# Patient Record
Sex: Female | Born: 1950 | Race: White | Hispanic: No | Marital: Married | Smoking: Never smoker
Health system: Southern US, Community
[De-identification: ages and names within clinical notes are randomized; demographics above are authoritative.]

## PROBLEM LIST (undated history)

## (undated) DIAGNOSIS — K219 Gastro-esophageal reflux disease without esophagitis: Secondary | ICD-10-CM

## (undated) DIAGNOSIS — E7800 Pure hypercholesterolemia, unspecified: Secondary | ICD-10-CM

## (undated) HISTORY — PX: EXCISION OF BREAST BIOPSY: SHX5822

## (undated) HISTORY — PX: BREAST EXCISIONAL BIOPSY: SUR124

---

## 2003-01-07 ENCOUNTER — Ambulatory Visit (HOSPITAL_COMMUNITY): Admission: RE | Admit: 2003-01-07 | Discharge: 2003-01-08 | Payer: Self-pay | Admitting: *Deleted

## 2004-10-18 ENCOUNTER — Ambulatory Visit: Payer: Self-pay | Admitting: Cardiology

## 2005-06-24 ENCOUNTER — Encounter: Admission: RE | Admit: 2005-06-24 | Discharge: 2005-06-24 | Payer: Self-pay | Admitting: Obstetrics and Gynecology

## 2006-08-09 ENCOUNTER — Encounter: Admission: RE | Admit: 2006-08-09 | Discharge: 2006-08-09 | Payer: Self-pay | Admitting: Family Medicine

## 2007-08-30 ENCOUNTER — Encounter: Admission: RE | Admit: 2007-08-30 | Discharge: 2007-08-30 | Payer: Self-pay | Admitting: Family Medicine

## 2008-09-19 ENCOUNTER — Encounter: Admission: RE | Admit: 2008-09-19 | Discharge: 2008-09-19 | Payer: Self-pay | Admitting: Family Medicine

## 2009-05-25 ENCOUNTER — Encounter: Admission: RE | Admit: 2009-05-25 | Discharge: 2009-05-25 | Payer: Self-pay | Admitting: Family Medicine

## 2009-10-02 ENCOUNTER — Encounter: Admission: RE | Admit: 2009-10-02 | Discharge: 2009-10-02 | Payer: Self-pay | Admitting: Family Medicine

## 2010-07-30 NOTE — Discharge Summary (Signed)
NAMEBRODY, Andrea Avery                          ACCOUNT NO.:  [id number]   MEDICAL RECORD NO.:  [id number]                   PATIENT TYPE:  OIB   LOCATION:  2021                                 FACILITY:  [facility]   PHYSICIAN:  Veneda Melter, M.D.                   DATE OF BIRTH:  Oct 24, 1950   DATE OF ADMISSION:  01/07/2003  DATE OF DISCHARGE:  01/08/2003                                 DISCHARGE SUMMARY   PRIMARY DIAGNOSIS:  Atypical chest pain.   SECONDARY DIAGNOSIS:  Hyperlipidemia.   HISTORY OF PRESENT ILLNESS:  This is a 60 year old female with treated  hyperlipidemia, on Lipitor.  She also has a family history of premature  coronary disease.  Her father died in his 60s with acute myocardial  infarction, mild hypertension.  She denies history of diabetes or tobacco  abuse.  She is otherwise a fairly active individual, walks or jogs two or  three times a week.  She works in [employer] at [employer].  The  patient, however, complains of a three-month history of substernal chest  pain, reports tightness.  She describes more burning sensation in lower  sternal, epigastric area.  She reports that the pain has been ongoing and  occurs on a daily basis but appears to worsen with exertion and stress.  She  was admitted on December 17, 2002, to [facility] due to worsening pain  complaints.  A 12-lead EKG was normal.  She ruled out for an MI.  Was seen  by Dr. Diona Browner at that time.  She had undergone a stress echo study which  was essentially normal although the images were only moderate quality.  It  was felt that the patient was at low risk.  She did receive further GI  workup with upper endoscopy.  However, due to concerns the patient could  have underlying cardiac disease, the patient was asked to Dr. Cleotis Nipper and  consider catheterization prior to further gastrointestinal workup.   The patient was admitted and underwent a cardiac catheterization.  Cardiac  catheterization shows an EF of 55% with no MR, left main normal, LAD 10-15%  proximal, left circumflex _________ with luminal disease, right intermedius  luminal disease, RCA dominant, trivial disease.  The patient was to be  treated with medical management.  On the day of discharge the patient  complained of some dizziness with standing.  She was given 1 L of lactated  Ringer's and had decrease in her symptoms.  The patient was ambulating in  the halls without dizziness later that afternoon.  Also of note, the  patient's hemoglobin on admission was 12.9, hematocrit 38.1, with decrease  to 10.9 and 32.9 on discharge.  There was no hematoma in her groin.  She  denied any back or abdominal tenderness and was later discharged to home in  stable condition.  She was discharged on:  1. Lipitor  5 mg nightly.  2. Coated aspirin 81 daily.  3. Claritin 10 as needed.  4. Tylenol 1-2 tablets every four to six hours as needed.   No heavy lifting or strenuous activity for four days, no driving for two.  Low-fat, low-salt, low-cholesterol diet.  She was to call if she developed a  lump or any drainage in her groin, any abdominal or back pain.  The patient  was scheduled to see the physician assistant at the [facility] on  January 22, 2003, at 2:15 p.m. and Dr. Dimas Aguas in 7-10 days.  She was to  call and schedule an appointment.  The patient was also to have a repeat CBC  done in one week.      [name], C.R.N.P. [facility]                 Veneda Melter, M.D.    DS/MEDQ  D:  01/08/2003  T:  01/08/2003  Job:  [id number]   cc:   Selinda Flavin  [address]  [place]  [place] [address]  Fax: [phone]   Kathaleen Maser. Cleotis Nipper, M.D.  [address]  [place]  [place] [address]  Fax: [phone]   [facility]  [place]

## 2010-07-30 NOTE — Cardiovascular Report (Signed)
Andrea Avery, Andrea Avery                          ACCOUNT NO.:  [id number]   MEDICAL RECORD NO.:  [id number]                   PATIENT TYPE:  OIB   LOCATION:  2894                                 FACILITY:  [facility]   PHYSICIAN:  Veneda Melter, M.D.                   DATE OF BIRTH:  [birth year]   DATE OF PROCEDURE:  01/07/2003  DATE OF DISCHARGE:                              CARDIAC CATHETERIZATION   PROCEDURES PERFORMED:  1. Left heart catheterization.  2. Left ventriculogram.  3. Selective coronary angiography.  4. Attempted Perclose right femoral artery.   DIAGNOSES:  1. Trivial coronary artery disease.  2. Normal left ventricular systolic function.   HISTORY:  Andrea Avery is a 59 year old white female with hypertension,  dyslipidemia and a family history of coronary disease who presents for  assessment of substernal chest discomfort.  The patient underwent a stress  echo recently which did not show ischemia, but it was suboptimal study due  to persistence of symptoms.  She is referred for further assessment.   TECHNIQUE:  Informed consent was obtained.  The patient brought to the  catheterization lab.  A 6 French sheath was placed in the right femoral  artery using the modified Seldinger technique.  A 6 [place] JL-4 and JR-4  catheter was then used to engage the left and right coronary arteries and  selective angiography performed in various projections using manual  injection contrast.  After termination of this case, the catheters and  sheaths were removed and a Perclose suture closure device was deployed to  the right femoral artery.  Unfortunately, adequate hemostasis was not  achieved and manual pressure was applied.  Hemostasis was then achieved and  the patient transferred to the floor in stable condition.   FINDINGS:   LEFT HEART CATHETERIZATION:  1. Left main trunk:  Large caliber vessel with luminal irregularities.  2. LAD:  This is a medium-caliber vessel that  provides a trivial diagonal     branch in the mid section.  The LAD has mild irregularities of 10-15%.  3. Left circumflex artery:  This is a large caliber vessel that provides a     large first marginal branch in the proximal segment and a small second     marginal branch distally.  The left circumflex system has luminal     irregularities.  4. Ramus intermedius:  This is a medium caliber vessel that provides     anterolateral wall.  The intermediate artery has luminal disease.  5. Right coronary artery dominant.  This is a medium caliber vessel that     provides small posterior descending artery in its terminal segment.  The     right coronary artery has luminal irregularities and mild tortuosity in     the proximal mid section.   LEFT VENTRICULOGRAPHY:  1. Normal end-systolic and end-diastolic dimensions.  2. Overall left ventricular function is  well preserved.  3. Ejection fraction greater than 55%.  4. No mitral regurgitation.  5. LV pressure is 120/10.  6. Aortic pressure is 120/70.  7. LVEDP equals 15.   ASSESSMENT AND PLAN:  [name] is a 60 year old female with trivial  coronary artery disease and well preserved LV function.  Continued medical  therapy will be pursued and other causes of chest pain be investigated.                                                 Veneda Melter, M.D.    NG/MEDQ  D:  01/07/2003  T:  01/07/2003  Job:  [id number]   cc:   Selinda Flavin  [address]  [place]  [place] [address]  Fax: [phone]   Learta Codding, M.D.  [phone]  [address]  [place]  [place] [address]

## 2010-11-08 ENCOUNTER — Other Ambulatory Visit: Payer: Self-pay | Admitting: Family Medicine

## 2010-11-08 DIAGNOSIS — Z1231 Encounter for screening mammogram for malignant neoplasm of breast: Secondary | ICD-10-CM

## 2010-11-19 ENCOUNTER — Ambulatory Visit
Admission: RE | Admit: 2010-11-19 | Discharge: 2010-11-19 | Disposition: A | Discharge status: Home / Self Care | Payer: BC Managed Care – PPO | Source: Ambulatory Visit | Attending: Family Medicine | Admitting: Family Medicine

## 2010-11-19 DIAGNOSIS — Z1231 Encounter for screening mammogram for malignant neoplasm of breast: Secondary | ICD-10-CM

## 2011-09-29 ENCOUNTER — Other Ambulatory Visit: Payer: Self-pay | Admitting: Family Medicine

## 2011-09-29 DIAGNOSIS — Z1231 Encounter for screening mammogram for malignant neoplasm of breast: Secondary | ICD-10-CM

## 2011-11-25 ENCOUNTER — Ambulatory Visit
Admission: RE | Admit: 2011-11-25 | Discharge: 2011-11-25 | Disposition: A | Discharge status: Home / Self Care | Payer: BC Managed Care – PPO | Source: Ambulatory Visit | Attending: Family Medicine | Admitting: Family Medicine

## 2011-11-25 DIAGNOSIS — Z1231 Encounter for screening mammogram for malignant neoplasm of breast: Secondary | ICD-10-CM

## 2012-10-26 ENCOUNTER — Other Ambulatory Visit: Payer: Self-pay

## 2012-10-26 DIAGNOSIS — Z1231 Encounter for screening mammogram for malignant neoplasm of breast: Secondary | ICD-10-CM

## 2012-11-26 ENCOUNTER — Ambulatory Visit
Admission: RE | Admit: 2012-11-26 | Discharge: 2012-11-26 | Disposition: A | Discharge status: Home / Self Care | Payer: BC Managed Care – PPO | Source: Ambulatory Visit

## 2012-11-26 DIAGNOSIS — Z1231 Encounter for screening mammogram for malignant neoplasm of breast: Secondary | ICD-10-CM

## 2013-11-12 ENCOUNTER — Other Ambulatory Visit: Payer: Self-pay

## 2013-11-12 DIAGNOSIS — Z1231 Encounter for screening mammogram for malignant neoplasm of breast: Secondary | ICD-10-CM

## 2013-11-28 ENCOUNTER — Ambulatory Visit
Admission: RE | Admit: 2013-11-28 | Discharge: 2013-11-28 | Disposition: A | Discharge status: Home / Self Care | Payer: BC Managed Care – PPO | Source: Ambulatory Visit

## 2013-11-28 DIAGNOSIS — Z1231 Encounter for screening mammogram for malignant neoplasm of breast: Secondary | ICD-10-CM

## 2014-11-04 ENCOUNTER — Other Ambulatory Visit: Payer: Self-pay

## 2014-11-04 DIAGNOSIS — Z1231 Encounter for screening mammogram for malignant neoplasm of breast: Secondary | ICD-10-CM

## 2014-12-10 ENCOUNTER — Ambulatory Visit
Admission: RE | Admit: 2014-12-10 | Discharge: 2014-12-10 | Disposition: A | Discharge status: Home / Self Care | Payer: Medicare Other | Source: Ambulatory Visit

## 2014-12-10 DIAGNOSIS — Z1231 Encounter for screening mammogram for malignant neoplasm of breast: Secondary | ICD-10-CM | POA: Diagnosis not present

## 2014-12-31 DIAGNOSIS — E7800 Pure hypercholesterolemia, unspecified: Secondary | ICD-10-CM | POA: Diagnosis not present

## 2015-01-07 DIAGNOSIS — E1165 Type 2 diabetes mellitus with hyperglycemia: Secondary | ICD-10-CM | POA: Diagnosis not present

## 2015-01-07 DIAGNOSIS — I10 Essential (primary) hypertension: Secondary | ICD-10-CM | POA: Diagnosis not present

## 2015-01-07 DIAGNOSIS — E039 Hypothyroidism, unspecified: Secondary | ICD-10-CM | POA: Diagnosis not present

## 2015-01-07 DIAGNOSIS — K219 Gastro-esophageal reflux disease without esophagitis: Secondary | ICD-10-CM | POA: Diagnosis not present

## 2015-01-07 DIAGNOSIS — M84374A Stress fracture, right foot, initial encounter for fracture: Secondary | ICD-10-CM | POA: Diagnosis not present

## 2015-01-07 DIAGNOSIS — E782 Mixed hyperlipidemia: Secondary | ICD-10-CM | POA: Diagnosis not present

## 2015-01-07 DIAGNOSIS — Z1389 Encounter for screening for other disorder: Secondary | ICD-10-CM | POA: Diagnosis not present

## 2015-01-07 DIAGNOSIS — Z23 Encounter for immunization: Secondary | ICD-10-CM | POA: Diagnosis not present

## 2015-01-28 DIAGNOSIS — M19042 Primary osteoarthritis, left hand: Secondary | ICD-10-CM | POA: Diagnosis not present

## 2015-01-28 DIAGNOSIS — S92341G Displaced fracture of fourth metatarsal bone, right foot, subsequent encounter for fracture with delayed healing: Secondary | ICD-10-CM | POA: Diagnosis not present

## 2015-09-17 DIAGNOSIS — E039 Hypothyroidism, unspecified: Secondary | ICD-10-CM | POA: Diagnosis not present

## 2015-09-17 DIAGNOSIS — I10 Essential (primary) hypertension: Secondary | ICD-10-CM | POA: Diagnosis not present

## 2015-09-17 DIAGNOSIS — E782 Mixed hyperlipidemia: Secondary | ICD-10-CM | POA: Diagnosis not present

## 2015-09-17 DIAGNOSIS — E1165 Type 2 diabetes mellitus with hyperglycemia: Secondary | ICD-10-CM | POA: Diagnosis not present

## 2015-09-17 DIAGNOSIS — E7800 Pure hypercholesterolemia, unspecified: Secondary | ICD-10-CM | POA: Diagnosis not present

## 2015-09-25 DIAGNOSIS — K219 Gastro-esophageal reflux disease without esophagitis: Secondary | ICD-10-CM | POA: Diagnosis not present

## 2015-09-25 DIAGNOSIS — E782 Mixed hyperlipidemia: Secondary | ICD-10-CM | POA: Diagnosis not present

## 2015-09-25 DIAGNOSIS — Z0000 Encounter for general adult medical examination without abnormal findings: Secondary | ICD-10-CM | POA: Diagnosis not present

## 2015-09-25 DIAGNOSIS — Z6824 Body mass index (BMI) 24.0-24.9, adult: Secondary | ICD-10-CM | POA: Diagnosis not present

## 2015-11-10 ENCOUNTER — Other Ambulatory Visit: Payer: Self-pay | Admitting: Family Medicine

## 2015-11-10 DIAGNOSIS — Z1231 Encounter for screening mammogram for malignant neoplasm of breast: Secondary | ICD-10-CM

## 2015-11-13 DIAGNOSIS — Z23 Encounter for immunization: Secondary | ICD-10-CM | POA: Diagnosis not present

## 2015-11-24 DIAGNOSIS — H40053 Ocular hypertension, bilateral: Secondary | ICD-10-CM | POA: Diagnosis not present

## 2015-12-11 ENCOUNTER — Ambulatory Visit: Payer: Medicare Other

## 2015-12-15 ENCOUNTER — Ambulatory Visit
Admission: RE | Admit: 2015-12-15 | Discharge: 2015-12-15 | Disposition: A | Discharge status: Home / Self Care | Payer: Medicare Other | Source: Ambulatory Visit | Attending: Family Medicine | Admitting: Family Medicine

## 2015-12-15 DIAGNOSIS — Z1231 Encounter for screening mammogram for malignant neoplasm of breast: Secondary | ICD-10-CM

## 2016-02-09 DIAGNOSIS — I10 Essential (primary) hypertension: Secondary | ICD-10-CM | POA: Diagnosis not present

## 2016-02-09 DIAGNOSIS — J019 Acute sinusitis, unspecified: Secondary | ICD-10-CM | POA: Diagnosis not present

## 2016-02-09 DIAGNOSIS — Z6824 Body mass index (BMI) 24.0-24.9, adult: Secondary | ICD-10-CM | POA: Diagnosis not present

## 2016-08-10 DIAGNOSIS — J019 Acute sinusitis, unspecified: Secondary | ICD-10-CM | POA: Diagnosis not present

## 2016-08-10 DIAGNOSIS — Z6824 Body mass index (BMI) 24.0-24.9, adult: Secondary | ICD-10-CM | POA: Diagnosis not present

## 2016-08-10 DIAGNOSIS — K1379 Other lesions of oral mucosa: Secondary | ICD-10-CM | POA: Diagnosis not present

## 2016-09-26 DIAGNOSIS — E1165 Type 2 diabetes mellitus with hyperglycemia: Secondary | ICD-10-CM | POA: Diagnosis not present

## 2016-09-26 DIAGNOSIS — E782 Mixed hyperlipidemia: Secondary | ICD-10-CM | POA: Diagnosis not present

## 2016-09-26 DIAGNOSIS — I10 Essential (primary) hypertension: Secondary | ICD-10-CM | POA: Diagnosis not present

## 2016-09-26 DIAGNOSIS — E7800 Pure hypercholesterolemia, unspecified: Secondary | ICD-10-CM | POA: Diagnosis not present

## 2016-09-29 DIAGNOSIS — E7800 Pure hypercholesterolemia, unspecified: Secondary | ICD-10-CM | POA: Diagnosis not present

## 2016-09-29 DIAGNOSIS — K219 Gastro-esophageal reflux disease without esophagitis: Secondary | ICD-10-CM | POA: Diagnosis not present

## 2016-09-29 DIAGNOSIS — Z6824 Body mass index (BMI) 24.0-24.9, adult: Secondary | ICD-10-CM | POA: Diagnosis not present

## 2016-09-29 DIAGNOSIS — Z0000 Encounter for general adult medical examination without abnormal findings: Secondary | ICD-10-CM | POA: Diagnosis not present

## 2016-10-03 DIAGNOSIS — M810 Age-related osteoporosis without current pathological fracture: Secondary | ICD-10-CM | POA: Diagnosis not present

## 2016-10-03 DIAGNOSIS — M85851 Other specified disorders of bone density and structure, right thigh: Secondary | ICD-10-CM | POA: Diagnosis not present

## 2016-11-15 ENCOUNTER — Other Ambulatory Visit: Payer: Self-pay | Admitting: Family Medicine

## 2016-11-15 DIAGNOSIS — Z1231 Encounter for screening mammogram for malignant neoplasm of breast: Secondary | ICD-10-CM

## 2016-12-13 DIAGNOSIS — Z23 Encounter for immunization: Secondary | ICD-10-CM | POA: Diagnosis not present

## 2016-12-16 ENCOUNTER — Ambulatory Visit
Admission: RE | Admit: 2016-12-16 | Discharge: 2016-12-16 | Disposition: A | Discharge status: Home / Self Care | Payer: Medicare Other | Source: Ambulatory Visit | Attending: Family Medicine | Admitting: Family Medicine

## 2016-12-16 DIAGNOSIS — Z1231 Encounter for screening mammogram for malignant neoplasm of breast: Secondary | ICD-10-CM | POA: Diagnosis not present

## 2017-01-12 DIAGNOSIS — H43393 Other vitreous opacities, bilateral: Secondary | ICD-10-CM | POA: Diagnosis not present

## 2017-05-12 DIAGNOSIS — M79641 Pain in right hand: Secondary | ICD-10-CM | POA: Diagnosis not present

## 2017-05-12 DIAGNOSIS — Z6823 Body mass index (BMI) 23.0-23.9, adult: Secondary | ICD-10-CM | POA: Diagnosis not present

## 2017-05-12 DIAGNOSIS — M1811 Unilateral primary osteoarthritis of first carpometacarpal joint, right hand: Secondary | ICD-10-CM | POA: Diagnosis not present

## 2017-05-25 DIAGNOSIS — M79641 Pain in right hand: Secondary | ICD-10-CM | POA: Diagnosis not present

## 2017-05-25 DIAGNOSIS — Z6823 Body mass index (BMI) 23.0-23.9, adult: Secondary | ICD-10-CM | POA: Diagnosis not present

## 2017-09-29 DIAGNOSIS — I10 Essential (primary) hypertension: Secondary | ICD-10-CM | POA: Diagnosis not present

## 2017-09-29 DIAGNOSIS — E782 Mixed hyperlipidemia: Secondary | ICD-10-CM | POA: Diagnosis not present

## 2017-09-29 DIAGNOSIS — E039 Hypothyroidism, unspecified: Secondary | ICD-10-CM | POA: Diagnosis not present

## 2017-09-29 DIAGNOSIS — E7801 Familial hypercholesterolemia: Secondary | ICD-10-CM | POA: Diagnosis not present

## 2017-09-29 DIAGNOSIS — E1165 Type 2 diabetes mellitus with hyperglycemia: Secondary | ICD-10-CM | POA: Diagnosis not present

## 2017-09-29 DIAGNOSIS — K219 Gastro-esophageal reflux disease without esophagitis: Secondary | ICD-10-CM | POA: Diagnosis not present

## 2017-10-04 DIAGNOSIS — Z23 Encounter for immunization: Secondary | ICD-10-CM | POA: Diagnosis not present

## 2017-10-04 DIAGNOSIS — Z0000 Encounter for general adult medical examination without abnormal findings: Secondary | ICD-10-CM | POA: Diagnosis not present

## 2017-10-04 DIAGNOSIS — I10 Essential (primary) hypertension: Secondary | ICD-10-CM | POA: Diagnosis not present

## 2017-10-04 DIAGNOSIS — E7801 Familial hypercholesterolemia: Secondary | ICD-10-CM | POA: Diagnosis not present

## 2017-10-04 DIAGNOSIS — Z6823 Body mass index (BMI) 23.0-23.9, adult: Secondary | ICD-10-CM | POA: Diagnosis not present

## 2017-10-10 DIAGNOSIS — N6002 Solitary cyst of left breast: Secondary | ICD-10-CM | POA: Diagnosis not present

## 2017-10-10 DIAGNOSIS — N281 Cyst of kidney, acquired: Secondary | ICD-10-CM | POA: Diagnosis not present

## 2017-10-10 DIAGNOSIS — K219 Gastro-esophageal reflux disease without esophagitis: Secondary | ICD-10-CM | POA: Diagnosis not present

## 2017-10-11 DIAGNOSIS — N6081 Other benign mammary dysplasias of right breast: Secondary | ICD-10-CM | POA: Diagnosis not present

## 2017-10-11 DIAGNOSIS — L720 Epidermal cyst: Secondary | ICD-10-CM | POA: Diagnosis not present

## 2017-10-11 DIAGNOSIS — L728 Other follicular cysts of the skin and subcutaneous tissue: Secondary | ICD-10-CM | POA: Diagnosis not present

## 2017-10-17 DIAGNOSIS — E1165 Type 2 diabetes mellitus with hyperglycemia: Secondary | ICD-10-CM | POA: Diagnosis not present

## 2017-10-27 DIAGNOSIS — R079 Chest pain, unspecified: Secondary | ICD-10-CM | POA: Diagnosis not present

## 2017-11-16 ENCOUNTER — Other Ambulatory Visit: Payer: Self-pay | Admitting: Family Medicine

## 2017-11-16 DIAGNOSIS — Z1231 Encounter for screening mammogram for malignant neoplasm of breast: Secondary | ICD-10-CM

## 2017-12-12 DIAGNOSIS — M5432 Sciatica, left side: Secondary | ICD-10-CM | POA: Diagnosis not present

## 2017-12-12 DIAGNOSIS — M5431 Sciatica, right side: Secondary | ICD-10-CM | POA: Diagnosis not present

## 2017-12-12 DIAGNOSIS — M545 Low back pain: Secondary | ICD-10-CM | POA: Diagnosis not present

## 2017-12-12 DIAGNOSIS — Z6824 Body mass index (BMI) 24.0-24.9, adult: Secondary | ICD-10-CM | POA: Diagnosis not present

## 2017-12-19 ENCOUNTER — Ambulatory Visit
Admission: RE | Admit: 2017-12-19 | Discharge: 2017-12-19 | Disposition: A | Discharge status: Home / Self Care | Payer: Medicare Other | Source: Ambulatory Visit | Attending: Family Medicine | Admitting: Family Medicine

## 2017-12-19 DIAGNOSIS — Z1231 Encounter for screening mammogram for malignant neoplasm of breast: Secondary | ICD-10-CM

## 2017-12-28 DIAGNOSIS — Z23 Encounter for immunization: Secondary | ICD-10-CM | POA: Diagnosis not present

## 2018-01-11 DIAGNOSIS — Z6823 Body mass index (BMI) 23.0-23.9, adult: Secondary | ICD-10-CM | POA: Diagnosis not present

## 2018-01-11 DIAGNOSIS — N3000 Acute cystitis without hematuria: Secondary | ICD-10-CM | POA: Diagnosis not present

## 2018-02-17 DIAGNOSIS — M940 Chondrocostal junction syndrome [Tietze]: Secondary | ICD-10-CM | POA: Diagnosis not present

## 2018-02-17 DIAGNOSIS — Z6823 Body mass index (BMI) 23.0-23.9, adult: Secondary | ICD-10-CM | POA: Diagnosis not present

## 2018-02-26 DIAGNOSIS — M940 Chondrocostal junction syndrome [Tietze]: Secondary | ICD-10-CM | POA: Diagnosis not present

## 2018-02-26 DIAGNOSIS — R079 Chest pain, unspecified: Secondary | ICD-10-CM | POA: Diagnosis not present

## 2018-03-21 DIAGNOSIS — M5430 Sciatica, unspecified side: Secondary | ICD-10-CM | POA: Diagnosis not present

## 2018-03-21 DIAGNOSIS — Z6823 Body mass index (BMI) 23.0-23.9, adult: Secondary | ICD-10-CM | POA: Diagnosis not present

## 2018-03-30 DIAGNOSIS — M5126 Other intervertebral disc displacement, lumbar region: Secondary | ICD-10-CM | POA: Diagnosis not present

## 2018-03-30 DIAGNOSIS — M545 Low back pain: Secondary | ICD-10-CM | POA: Diagnosis not present

## 2018-04-05 DIAGNOSIS — M5416 Radiculopathy, lumbar region: Secondary | ICD-10-CM | POA: Diagnosis not present

## 2018-04-06 ENCOUNTER — Other Ambulatory Visit: Payer: Self-pay | Admitting: Neurological Surgery

## 2018-04-06 DIAGNOSIS — M5416 Radiculopathy, lumbar region: Secondary | ICD-10-CM

## 2018-04-09 ENCOUNTER — Other Ambulatory Visit: Payer: Self-pay | Admitting: Neurological Surgery

## 2018-04-09 ENCOUNTER — Ambulatory Visit
Admission: RE | Admit: 2018-04-09 | Discharge: 2018-04-09 | Disposition: A | Discharge status: Home / Self Care | Payer: Medicare Other | Source: Ambulatory Visit | Attending: Neurological Surgery | Admitting: Neurological Surgery

## 2018-04-09 DIAGNOSIS — M5416 Radiculopathy, lumbar region: Secondary | ICD-10-CM

## 2018-04-09 DIAGNOSIS — M5126 Other intervertebral disc displacement, lumbar region: Secondary | ICD-10-CM | POA: Diagnosis not present

## 2018-04-09 MED ORDER — IOPAMIDOL (ISOVUE-M 200) INJECTION 41%
1.0000 mL | Freq: Once | INTRAMUSCULAR | Status: AC
  Administered 2018-04-09: 1 mL via EPIDURAL

## 2018-04-09 MED ORDER — METHYLPREDNISOLONE ACETATE 40 MG/ML INJ SUSP (RADIOLOG
120.0000 mg | Freq: Once | INTRAMUSCULAR | Status: AC
  Administered 2018-04-09: 120 mg via EPIDURAL

## 2018-04-09 NOTE — Discharge Instructions (Signed)

## 2018-09-20 DIAGNOSIS — H43813 Vitreous degeneration, bilateral: Secondary | ICD-10-CM | POA: Diagnosis not present

## 2018-09-20 DIAGNOSIS — H43812 Vitreous degeneration, left eye: Secondary | ICD-10-CM | POA: Diagnosis not present

## 2018-09-20 DIAGNOSIS — H40013 Open angle with borderline findings, low risk, bilateral: Secondary | ICD-10-CM | POA: Diagnosis not present

## 2018-09-20 DIAGNOSIS — H524 Presbyopia: Secondary | ICD-10-CM | POA: Diagnosis not present

## 2018-09-20 DIAGNOSIS — H43811 Vitreous degeneration, right eye: Secondary | ICD-10-CM | POA: Diagnosis not present

## 2018-09-20 DIAGNOSIS — H40053 Ocular hypertension, bilateral: Secondary | ICD-10-CM | POA: Diagnosis not present

## 2018-09-20 DIAGNOSIS — H2513 Age-related nuclear cataract, bilateral: Secondary | ICD-10-CM | POA: Diagnosis not present

## 2018-10-05 DIAGNOSIS — E7801 Familial hypercholesterolemia: Secondary | ICD-10-CM | POA: Diagnosis not present

## 2018-10-05 DIAGNOSIS — E1165 Type 2 diabetes mellitus with hyperglycemia: Secondary | ICD-10-CM | POA: Diagnosis not present

## 2018-10-05 DIAGNOSIS — E039 Hypothyroidism, unspecified: Secondary | ICD-10-CM | POA: Diagnosis not present

## 2018-10-05 DIAGNOSIS — E782 Mixed hyperlipidemia: Secondary | ICD-10-CM | POA: Diagnosis not present

## 2018-10-05 DIAGNOSIS — E7800 Pure hypercholesterolemia, unspecified: Secondary | ICD-10-CM | POA: Diagnosis not present

## 2018-10-05 DIAGNOSIS — I10 Essential (primary) hypertension: Secondary | ICD-10-CM | POA: Diagnosis not present

## 2018-10-05 DIAGNOSIS — K219 Gastro-esophageal reflux disease without esophagitis: Secondary | ICD-10-CM | POA: Diagnosis not present

## 2018-10-09 DIAGNOSIS — I10 Essential (primary) hypertension: Secondary | ICD-10-CM | POA: Diagnosis not present

## 2018-10-09 DIAGNOSIS — Z6823 Body mass index (BMI) 23.0-23.9, adult: Secondary | ICD-10-CM | POA: Diagnosis not present

## 2018-10-09 DIAGNOSIS — K219 Gastro-esophageal reflux disease without esophagitis: Secondary | ICD-10-CM | POA: Diagnosis not present

## 2018-10-09 DIAGNOSIS — E7801 Familial hypercholesterolemia: Secondary | ICD-10-CM | POA: Diagnosis not present

## 2018-10-24 DIAGNOSIS — Z6823 Body mass index (BMI) 23.0-23.9, adult: Secondary | ICD-10-CM | POA: Diagnosis not present

## 2018-10-24 DIAGNOSIS — R238 Other skin changes: Secondary | ICD-10-CM | POA: Diagnosis not present

## 2018-11-16 ENCOUNTER — Other Ambulatory Visit: Payer: Self-pay | Admitting: Family Medicine

## 2018-11-16 DIAGNOSIS — N644 Mastodynia: Secondary | ICD-10-CM

## 2018-11-20 DIAGNOSIS — Z23 Encounter for immunization: Secondary | ICD-10-CM | POA: Diagnosis not present

## 2018-11-27 ENCOUNTER — Other Ambulatory Visit: Payer: Self-pay

## 2018-11-27 ENCOUNTER — Ambulatory Visit
Admission: RE | Admit: 2018-11-27 | Discharge: 2018-11-27 | Disposition: A | Discharge status: Home / Self Care | Payer: Medicare Other | Source: Ambulatory Visit | Attending: Family Medicine | Admitting: Family Medicine

## 2018-11-27 ENCOUNTER — Ambulatory Visit: Payer: Medicare Other

## 2018-11-27 DIAGNOSIS — R928 Other abnormal and inconclusive findings on diagnostic imaging of breast: Secondary | ICD-10-CM | POA: Diagnosis not present

## 2018-11-27 DIAGNOSIS — N644 Mastodynia: Secondary | ICD-10-CM

## 2018-11-28 DIAGNOSIS — K219 Gastro-esophageal reflux disease without esophagitis: Secondary | ICD-10-CM | POA: Diagnosis not present

## 2018-12-04 DIAGNOSIS — Z01812 Encounter for preprocedural laboratory examination: Secondary | ICD-10-CM | POA: Diagnosis not present

## 2018-12-04 DIAGNOSIS — Z20828 Contact with and (suspected) exposure to other viral communicable diseases: Secondary | ICD-10-CM | POA: Diagnosis not present

## 2018-12-06 DIAGNOSIS — Z885 Allergy status to narcotic agent status: Secondary | ICD-10-CM | POA: Diagnosis not present

## 2018-12-06 DIAGNOSIS — K30 Functional dyspepsia: Secondary | ICD-10-CM | POA: Diagnosis not present

## 2018-12-06 DIAGNOSIS — K219 Gastro-esophageal reflux disease without esophagitis: Secondary | ICD-10-CM | POA: Diagnosis not present

## 2018-12-06 DIAGNOSIS — R1013 Epigastric pain: Secondary | ICD-10-CM | POA: Diagnosis not present

## 2018-12-06 DIAGNOSIS — K2950 Unspecified chronic gastritis without bleeding: Secondary | ICD-10-CM | POA: Diagnosis not present

## 2018-12-06 DIAGNOSIS — K2980 Duodenitis without bleeding: Secondary | ICD-10-CM | POA: Diagnosis not present

## 2018-12-07 DIAGNOSIS — L0501 Pilonidal cyst with abscess: Secondary | ICD-10-CM | POA: Diagnosis not present

## 2018-12-10 DIAGNOSIS — M85852 Other specified disorders of bone density and structure, left thigh: Secondary | ICD-10-CM | POA: Diagnosis not present

## 2018-12-10 DIAGNOSIS — M810 Age-related osteoporosis without current pathological fracture: Secondary | ICD-10-CM | POA: Diagnosis not present

## 2018-12-10 DIAGNOSIS — M8589 Other specified disorders of bone density and structure, multiple sites: Secondary | ICD-10-CM | POA: Diagnosis not present

## 2019-01-11 DIAGNOSIS — I10 Essential (primary) hypertension: Secondary | ICD-10-CM | POA: Diagnosis not present

## 2019-01-11 DIAGNOSIS — E782 Mixed hyperlipidemia: Secondary | ICD-10-CM | POA: Diagnosis not present

## 2019-02-01 ENCOUNTER — Other Ambulatory Visit: Payer: Self-pay

## 2019-02-11 DIAGNOSIS — E782 Mixed hyperlipidemia: Secondary | ICD-10-CM | POA: Diagnosis not present

## 2019-02-11 DIAGNOSIS — I10 Essential (primary) hypertension: Secondary | ICD-10-CM | POA: Diagnosis not present

## 2019-02-11 DIAGNOSIS — E039 Hypothyroidism, unspecified: Secondary | ICD-10-CM | POA: Diagnosis not present

## 2019-03-14 DIAGNOSIS — I10 Essential (primary) hypertension: Secondary | ICD-10-CM | POA: Diagnosis not present

## 2019-03-14 DIAGNOSIS — E782 Mixed hyperlipidemia: Secondary | ICD-10-CM | POA: Diagnosis not present

## 2019-04-05 DIAGNOSIS — K644 Residual hemorrhoidal skin tags: Secondary | ICD-10-CM | POA: Diagnosis not present

## 2019-04-12 DIAGNOSIS — E1165 Type 2 diabetes mellitus with hyperglycemia: Secondary | ICD-10-CM | POA: Diagnosis not present

## 2019-04-12 DIAGNOSIS — E039 Hypothyroidism, unspecified: Secondary | ICD-10-CM | POA: Diagnosis not present

## 2019-04-24 DIAGNOSIS — X32XXXS Exposure to sunlight, sequela: Secondary | ICD-10-CM | POA: Diagnosis not present

## 2019-04-24 DIAGNOSIS — L218 Other seborrheic dermatitis: Secondary | ICD-10-CM | POA: Diagnosis not present

## 2019-04-24 DIAGNOSIS — L918 Other hypertrophic disorders of the skin: Secondary | ICD-10-CM | POA: Diagnosis not present

## 2019-05-10 DIAGNOSIS — I10 Essential (primary) hypertension: Secondary | ICD-10-CM | POA: Diagnosis not present

## 2019-05-10 DIAGNOSIS — E7849 Other hyperlipidemia: Secondary | ICD-10-CM | POA: Diagnosis not present

## 2019-06-04 ENCOUNTER — Other Ambulatory Visit: Payer: Self-pay | Admitting: Neurological Surgery

## 2019-06-04 DIAGNOSIS — M5416 Radiculopathy, lumbar region: Secondary | ICD-10-CM

## 2019-06-06 ENCOUNTER — Other Ambulatory Visit: Payer: Self-pay | Admitting: Neurological Surgery

## 2019-06-06 ENCOUNTER — Ambulatory Visit
Admission: RE | Admit: 2019-06-06 | Discharge: 2019-06-06 | Disposition: A | Discharge status: Home / Self Care | Payer: Medicare Other | Source: Ambulatory Visit | Attending: Neurological Surgery | Admitting: Neurological Surgery

## 2019-06-06 ENCOUNTER — Other Ambulatory Visit: Payer: Self-pay

## 2019-06-06 DIAGNOSIS — K219 Gastro-esophageal reflux disease without esophagitis: Secondary | ICD-10-CM | POA: Insufficient documentation

## 2019-06-06 DIAGNOSIS — M5416 Radiculopathy, lumbar region: Secondary | ICD-10-CM | POA: Diagnosis not present

## 2019-06-06 MED ORDER — METHYLPREDNISOLONE ACETATE 40 MG/ML INJ SUSP (RADIOLOG
120.0000 mg | Freq: Once | INTRAMUSCULAR | Status: AC
  Administered 2019-06-06: 120 mg via EPIDURAL

## 2019-06-06 MED ORDER — IOPAMIDOL (ISOVUE-M 200) INJECTION 41%
1.0000 mL | Freq: Once | INTRAMUSCULAR | Status: AC
  Administered 2019-06-06: 1 mL via EPIDURAL

## 2019-06-06 NOTE — Discharge Instructions (Signed)

## 2019-07-18 DIAGNOSIS — R079 Chest pain, unspecified: Secondary | ICD-10-CM | POA: Diagnosis not present

## 2019-07-18 DIAGNOSIS — M940 Chondrocostal junction syndrome [Tietze]: Secondary | ICD-10-CM | POA: Diagnosis not present

## 2019-07-23 DIAGNOSIS — D7389 Other diseases of spleen: Secondary | ICD-10-CM | POA: Diagnosis not present

## 2019-07-23 DIAGNOSIS — J984 Other disorders of lung: Secondary | ICD-10-CM | POA: Diagnosis not present

## 2019-07-23 DIAGNOSIS — I700 Atherosclerosis of aorta: Secondary | ICD-10-CM | POA: Diagnosis not present

## 2019-07-23 DIAGNOSIS — R079 Chest pain, unspecified: Secondary | ICD-10-CM | POA: Diagnosis not present

## 2019-07-26 DIAGNOSIS — E1165 Type 2 diabetes mellitus with hyperglycemia: Secondary | ICD-10-CM | POA: Diagnosis not present

## 2019-07-26 DIAGNOSIS — I10 Essential (primary) hypertension: Secondary | ICD-10-CM | POA: Diagnosis not present

## 2019-08-01 DIAGNOSIS — R079 Chest pain, unspecified: Secondary | ICD-10-CM | POA: Diagnosis not present

## 2019-08-01 DIAGNOSIS — I340 Nonrheumatic mitral (valve) insufficiency: Secondary | ICD-10-CM | POA: Diagnosis not present

## 2019-08-01 DIAGNOSIS — E785 Hyperlipidemia, unspecified: Secondary | ICD-10-CM | POA: Diagnosis not present

## 2019-08-12 DIAGNOSIS — I10 Essential (primary) hypertension: Secondary | ICD-10-CM | POA: Diagnosis not present

## 2019-08-12 DIAGNOSIS — E7849 Other hyperlipidemia: Secondary | ICD-10-CM | POA: Diagnosis not present

## 2019-08-12 DIAGNOSIS — E039 Hypothyroidism, unspecified: Secondary | ICD-10-CM | POA: Diagnosis not present

## 2019-08-12 DIAGNOSIS — E1165 Type 2 diabetes mellitus with hyperglycemia: Secondary | ICD-10-CM | POA: Diagnosis not present

## 2019-08-14 DIAGNOSIS — N644 Mastodynia: Secondary | ICD-10-CM | POA: Diagnosis not present

## 2019-08-14 DIAGNOSIS — R928 Other abnormal and inconclusive findings on diagnostic imaging of breast: Secondary | ICD-10-CM | POA: Diagnosis not present

## 2019-08-20 DIAGNOSIS — S40261A Insect bite (nonvenomous) of right shoulder, initial encounter: Secondary | ICD-10-CM | POA: Diagnosis not present

## 2019-08-20 DIAGNOSIS — Z6824 Body mass index (BMI) 24.0-24.9, adult: Secondary | ICD-10-CM | POA: Diagnosis not present

## 2019-09-11 DIAGNOSIS — E1165 Type 2 diabetes mellitus with hyperglycemia: Secondary | ICD-10-CM | POA: Diagnosis not present

## 2019-09-11 DIAGNOSIS — I10 Essential (primary) hypertension: Secondary | ICD-10-CM | POA: Diagnosis not present

## 2019-09-11 DIAGNOSIS — E039 Hypothyroidism, unspecified: Secondary | ICD-10-CM | POA: Diagnosis not present

## 2019-09-11 DIAGNOSIS — E7849 Other hyperlipidemia: Secondary | ICD-10-CM | POA: Diagnosis not present

## 2019-10-23 ENCOUNTER — Other Ambulatory Visit: Payer: Self-pay | Admitting: Family Medicine

## 2019-10-23 DIAGNOSIS — Z1231 Encounter for screening mammogram for malignant neoplasm of breast: Secondary | ICD-10-CM

## 2019-11-09 ENCOUNTER — Other Ambulatory Visit: Payer: Self-pay

## 2019-11-09 ENCOUNTER — Emergency Department (HOSPITAL_COMMUNITY)
Admission: EM | Admit: 2019-11-09 | Discharge: 2019-11-09 | Disposition: A | Discharge status: Home / Self Care | Payer: Medicare Other | Attending: Emergency Medicine | Admitting: Emergency Medicine

## 2019-11-09 ENCOUNTER — Emergency Department (HOSPITAL_COMMUNITY): Payer: Medicare Other

## 2019-11-09 ENCOUNTER — Encounter (HOSPITAL_COMMUNITY): Payer: Self-pay | Admitting: Emergency Medicine

## 2019-11-09 DIAGNOSIS — R519 Headache, unspecified: Secondary | ICD-10-CM | POA: Diagnosis not present

## 2019-11-09 DIAGNOSIS — H538 Other visual disturbances: Secondary | ICD-10-CM | POA: Diagnosis not present

## 2019-11-09 DIAGNOSIS — Z79899 Other long term (current) drug therapy: Secondary | ICD-10-CM | POA: Diagnosis not present

## 2019-11-09 DIAGNOSIS — R9431 Abnormal electrocardiogram [ECG] [EKG]: Secondary | ICD-10-CM | POA: Diagnosis not present

## 2019-11-09 HISTORY — DX: Gastro-esophageal reflux disease without esophagitis: K21.9

## 2019-11-09 HISTORY — DX: Pure hypercholesterolemia, unspecified: E78.00

## 2019-11-09 LAB — CBC
HCT: 41.4 % (ref 36.0–46.0)
Hemoglobin: 13.7 g/dL (ref 12.0–15.0)
MCH: 29.8 pg (ref 26.0–34.0)
MCHC: 33.1 g/dL (ref 30.0–36.0)
MCV: 90.2 fL (ref 80.0–100.0)
Platelets: 178 10*3/uL (ref 150–400)
RBC: 4.59 MIL/uL (ref 3.87–5.11)
RDW: 13.5 % (ref 11.5–15.5)
WBC: 7.8 10*3/uL (ref 4.0–10.5)
nRBC: 0 % (ref 0.0–0.2)

## 2019-11-09 LAB — DIFFERENTIAL
Abs Immature Granulocytes: 0.02 10*3/uL (ref 0.00–0.07)
Basophils Absolute: 0 10*3/uL (ref 0.0–0.1)
Basophils Relative: 0 %
Eosinophils Absolute: 0.1 10*3/uL (ref 0.0–0.5)
Eosinophils Relative: 1 %
Immature Granulocytes: 0 %
Lymphocytes Relative: 30 %
Lymphs Abs: 2.3 10*3/uL (ref 0.7–4.0)
Monocytes Absolute: 0.9 10*3/uL (ref 0.1–1.0)
Monocytes Relative: 12 %
Neutro Abs: 4.4 10*3/uL (ref 1.7–7.7)
Neutrophils Relative %: 57 %

## 2019-11-09 LAB — COMPREHENSIVE METABOLIC PANEL
ALT: 27 U/L (ref 0–44)
AST: 30 U/L (ref 15–41)
Albumin: 4.5 g/dL (ref 3.5–5.0)
Alkaline Phosphatase: 39 U/L (ref 38–126)
Anion gap: 12 (ref 5–15)
BUN: 16 mg/dL (ref 8–23)
CO2: 25 mmol/L (ref 22–32)
Calcium: 9.7 mg/dL (ref 8.9–10.3)
Chloride: 103 mmol/L (ref 98–111)
Creatinine, Ser: 0.81 mg/dL (ref 0.44–1.00)
GFR calc Af Amer: >60 mL/min (ref >60)
GFR calc non Af Amer: >60 mL/min (ref >60)
Glucose, Bld: 104 mg/dL — ABNORMAL HIGH (ref 70–99)
Potassium: 3.6 mmol/L (ref 3.5–5.1)
Sodium: 140 mmol/L (ref 135–145)
Total Bilirubin: 0.6 mg/dL (ref 0.3–1.2)
Total Protein: 7 g/dL (ref 6.5–8.1)

## 2019-11-09 MED ORDER — SODIUM CHLORIDE 0.9% FLUSH: 3.0000 mL | Freq: Once | INTRAVENOUS | Status: DC

## 2019-11-09 NOTE — Discharge Instructions (Signed)
Please return for any problem.   Follow up with [facility] on an outpatient basis.

## 2019-11-09 NOTE — ED Triage Notes (Signed)
C/o intermittent R sided headache, unsteady gait,  blurred vision to bilateral eyes, and disorientation x 2 weeks.  Currently alert and oriented.  No arm drift.

## 2019-11-09 NOTE — ED Provider Notes (Signed)
[place] [facility] Provider Note   CSN: [id number] Arrival date & time: 11/09/19  1231     History Chief Complaint  Patient presents with  . Headache  . Blurred Vision    Andrea Avery is a 69 y.o. female.  69 year old female with prior medical history detailed below presents for evaluation of intermittent headache.  Patient reports intermittent headaches over the last 2 weeks.  Patient reports that headache will occur every other day.  When she has a headache it will last a whole day.  She is currently pain-free.  While having a headache she reports feeling like she is unsteady.  She has had no falls.  She also reports associated blurriness of her vision while having a headache.  She is taken Tylenol at home with some improvement in her symptoms.  She denies associated fever, neck pain, chest pain, shortness of breath, focal weakness, or other complaint.  Again, she is currently asymptomatic without complaint of blurry vision, headache, or nausea.  The history is provided by the patient and medical records.  Headache Pain location:  Generalized Quality:  Dull Radiates to:  Does not radiate Severity currently:  0/10 Severity at highest:  7/10 Onset quality:  Gradual Duration:  2 weeks Timing:  Intermittent Progression:  Waxing and waning Chronicity:  New Relieved by:  Acetaminophen Worsened by:  Nothing Ineffective treatments:  Acetaminophen      Past Medical History:  Diagnosis Date  . Acid reflux   . High cholesterol     Patient Active Problem List   Diagnosis Date Noted  . GERD (gastroesophageal reflux disease) 06/06/2019    Past Surgical History:  Procedure Laterality Date  . BREAST EXCISIONAL BIOPSY Right   . EXCISION OF BREAST BIOPSY Left    benign 09/2017 [place]     OB History   No obstetric history on file.     No family history on file.  Social History   Tobacco Use  . Smoking status: Never Smoker  .  Smokeless tobacco: Never Used  Substance Use Topics  . Alcohol use: Not Currently  . Drug use: Not Currently    Home Medications Prior to Admission medications   Medication Sig Start Date End Date Taking? Authorizing Provider  hydrocortisone (ANUSOL-HC) 2.5 % rectal cream Apply 1 application topically 2 (two) times daily. 04/19/19   [provider]  ibuprofen (ADVIL) 600 MG tablet TAKE 1 TABLET BY MOUTH EVERY 6 TO 8 HOURS AS NEEDED FOR PAIN 09/11/17   [provider]  rosuvastatin (CRESTOR) 10 MG tablet Take 10 mg by mouth at bedtime. 04/11/19   [provider]    Allergies    Codeine, Prednisone, and Statins  Review of Systems   Review of Systems  Neurological: Positive for headaches.  All other systems reviewed and are negative.   Physical Exam Updated Vital Signs BP 134/80 (BP Location: Left Arm)   Pulse 84   Temp 98.6 F (37 C) (Oral)   Resp 16   SpO2 97%   Physical Exam Vitals and nursing note reviewed.  Constitutional:      General: She is not in acute distress.    Appearance: She is well-developed.  HENT:     Head: Normocephalic and atraumatic.  Eyes:     General: No visual field deficit.    Conjunctiva/sclera: Conjunctivae normal.     Pupils: Pupils are equal, round, and reactive to light.  Cardiovascular:     Rate and Rhythm:  Normal rate and regular rhythm.     Heart sounds: Normal heart sounds.  Pulmonary:     Effort: Pulmonary effort is normal. No respiratory distress.     Breath sounds: Normal breath sounds.  Abdominal:     General: There is no distension.     Palpations: Abdomen is soft.     Tenderness: There is no abdominal tenderness.  Musculoskeletal:        General: No deformity. Normal range of motion.     Cervical back: Normal range of motion and neck supple.  Skin:    General: Skin is warm and dry.  Neurological:     Mental Status: She is alert and oriented to person, place, and time. Mental status is at baseline.      Cranial Nerves: No cranial nerve deficit, dysarthria or facial asymmetry.     Sensory: No sensory deficit.     Motor: No weakness.     Coordination: Romberg sign negative. Coordination normal.  Psychiatric:        Mood and Affect: Mood normal.        Speech: Speech normal.     ED Results / Procedures / Treatments   Labs (all labs ordered are listed, but only abnormal results are displayed) Labs Reviewed  COMPREHENSIVE METABOLIC PANEL - Abnormal; Notable for the following components:      Result Value   Glucose, Bld 104 (*)    All other components within normal limits  CBC  DIFFERENTIAL  PROTIME-INR  APTT    EKG EKG Interpretation  Date/Time:  Saturday November 09 2019 13:25:25 EDT Ventricular Rate:  89 PR Interval:  134 QRS Duration: 80 QT Interval:  356 QTC Calculation: 433 R Axis:   41 Text Interpretation: Normal sinus rhythm Nonspecific ST and T wave abnormality Abnormal ECG Confirmed by Dene Gentry [phone]) on 11/09/2019 3:54:24 PM   Radiology CT HEAD WO CONTRAST  Result Date: 11/09/2019 CLINICAL DATA:  Neuro deficit. Acute stroke suspected. Right-sided headache. Blurred vision. EXAM: CT HEAD WITHOUT CONTRAST TECHNIQUE: Contiguous axial images were obtained from the base of the skull through the vertex without intravenous contrast. COMPARISON:  None. FINDINGS: Brain: No subdural, epidural, or subarachnoid hemorrhage. Subtle focal low-attenuation in the white matter of the right frontal lobe, immediately underlying the cortex. No other significant white matter changes are noted. No acute cortical ischemia identified. Ventricles and sulci are unremarkable. No mass effect or midline shift. The cerebellum, brainstem, and basal cisterns are normal. Vascular: No hyperdense vessel or unexpected calcification. Skull: Normal. Negative for fracture or focal lesion. Sinuses/Orbits: No acute finding. Other: None. IMPRESSION: 1. Focal subtle low-attenuation white matter underlying the  right frontal cortex is age indeterminate. No acute cortical ischemia or infarct identified. No other intracranial abnormalities are noted. Electronically Signed   By: Dorise Bullion III M.D   On: 11/09/2019 14:46    Procedures Procedures (including critical care time)  Medications Ordered in ED Medications  sodium chloride flush (NS) 0.9 % injection 3 mL (has no administration in time range)    ED Course  I have reviewed the triage vital signs and the nursing notes.  Pertinent labs & imaging results that were available during my care of the patient were reviewed by me and considered in my medical decision making (see chart for details).    MDM Rules/Calculators/A&P                          MDM  Screen complete  Andrea Avery was evaluated in Emergency Department on 11/09/2019 for the symptoms described in the history of present illness. She was evaluated in the context of the global COVID-19 pandemic, which necessitated consideration that the patient might be at risk for infection with the SARS-CoV-2 virus that causes COVID-19. Institutional protocols and algorithms that pertain to the evaluation of patients at risk for COVID-19 are in a state of rapid change based on information released by regulatory bodies including the CDC and federal and state organizations. These policies and algorithms were followed during the patient's care in the ED.  Patient is presenting for evaluation of reported intermittent headache.  Patient is currently asymptomatic.  Screening labs and CT imaging performed during the wait time are without significant finding.  Patient is neurologically intact at time of this evaluation.  She is asymptomatic at this time.  Case discussed briefly with Dr. Cheral Marker - on-call neuro hospitalist -he agrees that further imaging or work-up in the ED or as an inpatient is not warranted at this time.  He does agree that outpatient follow-up with neurology would be  appropriate.  The patient does understand the need for close outpatient follow-up.  She was also given strict return precautions including - return of worsening headache, fever, focal weakness, or other complaint.   Final Clinical Impression(s) / ED Diagnoses Final diagnoses:  Nonintractable headache, unspecified chronicity pattern, unspecified headache type    Rx / DC Orders ED Discharge Orders    None       Valarie Merino, MD 11/09/19 [phone]

## 2019-11-12 DIAGNOSIS — I10 Essential (primary) hypertension: Secondary | ICD-10-CM | POA: Diagnosis not present

## 2019-11-12 DIAGNOSIS — E7849 Other hyperlipidemia: Secondary | ICD-10-CM | POA: Diagnosis not present

## 2019-11-12 DIAGNOSIS — E1165 Type 2 diabetes mellitus with hyperglycemia: Secondary | ICD-10-CM | POA: Diagnosis not present

## 2019-11-12 DIAGNOSIS — E039 Hypothyroidism, unspecified: Secondary | ICD-10-CM | POA: Diagnosis not present

## 2019-11-19 ENCOUNTER — Encounter: Payer: Self-pay | Admitting: Neurology

## 2019-11-19 ENCOUNTER — Ambulatory Visit (INDEPENDENT_AMBULATORY_CARE_PROVIDER_SITE_OTHER): Payer: Medicare Other | Admitting: Neurology

## 2019-11-19 ENCOUNTER — Other Ambulatory Visit: Payer: Self-pay

## 2019-11-19 VITALS — BP 160/91 | HR 87 | Ht 61.5 in | Wt 129.0 lb

## 2019-11-19 DIAGNOSIS — T50901A Poisoning by unspecified drugs, medicaments and biological substances, accidental (unintentional), initial encounter: Secondary | ICD-10-CM

## 2019-11-19 DIAGNOSIS — G43109 Migraine with aura, not intractable, without status migrainosus: Secondary | ICD-10-CM | POA: Diagnosis not present

## 2019-11-19 DIAGNOSIS — F4489 Other dissociative and conversion disorders: Secondary | ICD-10-CM | POA: Diagnosis not present

## 2019-11-19 MED ORDER — PROPRANOLOL HCL 20 MG PO TABS: 20.0000 mg | ORAL_TABLET | Freq: Two times a day (BID) | ORAL | 6 refills | Status: DC

## 2019-11-19 NOTE — Progress Notes (Signed)
SLEEP MEDICINE CLINIC    Provider:  Larey Seat, MD  Primary Care Physician:  Rory Percy, MD [place] [address]     Referring Provider: Rory Percy, [place],  [place] [address]          Chief Complaint according to patient   Patient presents with:    . New Patient (Initial Visit)     started up around beginning of august. the headaches would hurt on the right top of head and posterior right head. she would go lay down until it would pass.       Other On 8/26 she placed a scop patch on because they were at beach and had a boat ride schduled for 8/27. she then developed blurred vision, confusion this lasted til sunday and she went to ER. CT completed.in ER.    Recent Visits with Me   None Other Visits in Neurology   None         HISTORY OF PRESENT ILLNESS:  Andrea Avery is a 69 y.o. year old right -handed  Caucasian female who  has a past medical history of Acid reflux and High cholesterol. she has a chief complaint of  Dizziness, mental confusion and headaches onset 11-07-2019.    Andrea Avery reports that they were preparing for a boat tour the day after scheduled and she was given a scopolamine patch to prevent any motion sickness or seasickness.  The scopolamine patch was placed behind her ear and already the night before the boat.  She lost the patch after less than in an hour and replaced it with a new one-  She had noticed some headache at the time but she was not dizzy or confused however the next morning her headaches were gone but she was confused or as her daughter called it "talking out of her head".  She felt also severely dizzy.  She developed blurred vision and all this lasted through Suturday 8-28- (but by Sunday, 11-10-2019 she felt a little better) when she went to the emergency room where a CT of the head was completed. I was reviewing the information that Dr. Nadara Mustard, [facility] [place] [facility]  stated that the patient arrived at 1230 on 11-09-19 with headaches blurred vision and a generalized dull headache that does not radiate.  She also has a history of migraines which have since then occurred a couple of times.  Today is a normal headache today.  She reports she is not photophobic, headaches may occur every other day and when she has a headache it may last a whole day.  Headaches also seem to be associated with a feeling of being unsteady or lightheaded but she had no falls.  She has no diplopia no fever neck pain or stiffness she is currently asymptomatic EKG on Saturday also was normal CT of the head was performed under stroke protocol no bleed or stroke was noted, no acute cortical ischemia no other intracranial abnormalities.  She was described as neurologically intact and seen by Dr. Cheral Marker neuro hospitalist neurologist.   She describes her usual migraines as affecting hr every other day and sharp, throbbing - other days more more tension, retrorbital pressure and , radiating to the right head and associated with photophobia, nausea . She has to lay down - can't function, dark room , quit . Ibuprofen helps a little bit.    her husband has cancer -  completed chemo therapy. Social history:  Patient is retired from [employer] , [employer] ,  and lives in a household with spouse.  Tobacco use- never .  ETOH use - rarely ,  Caffeine intake in form of Coffee( 2-3 cups a day ) Soda(/) Tea ( /) nor energy drinks.       Review of Systems: Out of a complete 14 system review, the patient complains of only the following symptoms, and all other reviewed systems are negative.:   Migraine   Social History   Socioeconomic History  . Marital status: Married    Spouse name: Not on file  . Number of children: Not on file  . Years of education: Not on file  . Highest education level: Not on file  Occupational History  . Not on file  Tobacco Use  . Smoking status: Never Smoker  . Smokeless  tobacco: Never Used  Substance and Sexual Activity  . Alcohol use: Not Currently  . Drug use: Not Currently  . Sexual activity: Not on file  Other Topics Concern  . Not on file  Social History Narrative  . Not on file   Social Determinants of Health   Financial Resource Strain:   . Difficulty of Paying Living Expenses: Not on file  Food Insecurity:   . Worried About Charity fundraiser in the Last Year: Not on file  . Ran Out of Food in the Last Year: Not on file  Transportation Needs:   . Lack of Transportation (Medical): Not on file  . Lack of Transportation (Non-Medical): Not on file  Physical Activity:   . Days of Exercise per Week: Not on file  . Minutes of Exercise per Session: Not on file  Stress:   . Feeling of Stress : Not on file  Social Connections:   . Frequency of Communication with Friends and Family: Not on file  . Frequency of Social Gatherings with Friends and Family: Not on file  . Attends Religious Services: Not on file  . Active Member of Clubs or Organizations: Not on file  . Attends Archivist Meetings: Not on file  . Marital Status: Not on file    No family history on file.  Past Medical History:  Diagnosis Date  . Acid reflux   . High cholesterol     Past Surgical History:  Procedure Laterality Date  . BREAST EXCISIONAL BIOPSY Right   . EXCISION OF BREAST BIOPSY Left    benign 09/2017 [place]     Current Outpatient Medications on File Prior to Visit  Medication Sig Dispense Refill  . hydrocortisone (ANUSOL-HC) 2.5 % rectal cream Apply 1 application topically 2 (two) times daily.    Marland Kitchen ibuprofen (ADVIL) 600 MG tablet TAKE 1 TABLET BY MOUTH EVERY 6 TO 8 HOURS AS NEEDED FOR PAIN    . rosuvastatin (CRESTOR) 10 MG tablet Take 10 mg by mouth at bedtime.     No current facility-administered medications on file prior to visit.    Allergies  Allergen Reactions  . Codeine   . Prednisone   . Statins     Physical exam:  Today's  Vitals   11/19/19 1324  BP: (!) 160/91  Pulse: 87  Weight: 129 lb (58.5 kg)  Height: 5' 1.5" (1.562 m)   Body mass index is 23.98 kg/m.   Wt Readings from Last 3 Encounters:  11/19/19 129 lb (58.5 kg)     Ht Readings from Last 3 Encounters:  11/19/19  5' 1.5" (1.562 m)      General: The patient is awake, alert and appears not in acute distress. The patient is well groomed. Head: Normocephalic, atraumatic. Neck is supple. Cardiovascular:  Regular rate and cardiac rhythm by pulse,  without distended neck veins. Respiratory: Lungs are clear to auscultation.  Skin:  Without evidence of ankle edema, or rash. Trunk: The patient's posture is erect.   Neurologic exam : The patient is awake and alert, oriented to place and time.   Memory subjective described as intact.  Attention span & concentration ability appears normal.  Speech is fluent,  without  dysarthria, dysphonia or aphasia.  Mood and affect are appropriate.   Cranial nerves: no loss of smell or taste reported  Pupils are equal and briskly reactive to light. Funduscopic exam deferred.beginning cataract.  .  Extraocular movements in vertical and horizontal planes were intact and with left and right  Nystagmus.  No Diplopia. Visual fields by finger perimetry are intact. Hearing was intact to soft voice and finger rubbing.    Facial sensation intact to fine touch.  Facial motor strength is symmetric and tongue and uvula move midline.  Neck ROM : rotation, tilt and flexion extension were normal for age and shoulder shrug was symmetrical.    Motor exam:  Symmetric bulk, tone and ROM.   Normal tone without cog- wheeling, symmetric grip strength .   Sensory:  Fine touch, pinprick and vibration were tested  and  normal.  Proprioception tested in the upper extremities was normal.   Coordination: Rapid alternating movements in the fingers/hands were of normal speed.  The Finger-to-nose maneuver was intact without evidence of  ataxia, dysmetria or tremor.   Gait and station: Patient could rise unassisted from a seated position, walked without assistive device.  Stance is of normal width/ base and the patient turned with 3 steps.  Toe and heel walk were intact.   Deep tendon reflexes: in the  upper and lower extremities are symmetric and intact.  Babinski response was deferred.      After spending a total time of 45 minutes face to face  time for physical and neurologic examination, review of laboratory studies,  personal review of imaging studies, reports and results of other testing and review of referral information / records as far as provided in visit, I have established the following assessments:    1) Andrea Avery took a 3 mg scopolamine patch on the night of 8-26 and was on the lower to replace it with another 1.  The transdermal patch should last 72 hours it is very possible that she got an overdose right from the replacement patch being placed.  And it can cause severe drowsiness but also lightheadedness and dizziness and blurred vision is one of the main side effects of scopolamine.  So based on this description of events for the 26-20 7 August I think it is strictly related to the scopolamine dose and by now it should have left her system. and her mental status recovered within 48 hours .  Her CT does not give reason for concern however she has an established history of migrainous headaches that seem to be right brain hemispheric dominant dull, throbbing with a retro-orbital pressure and associated at least with somewhat reduced appetite if not nausea, also with vision changes and photophobia.  These occur almost every other day which would make her a very frequent migraine patient and what ever the triggers if dietary, sleep-related or stress related  this frequency of almost 15 migrainous headaches a month definitely warrants taking a preventive medication.  Looking at her blood pressure which is today a  little elevated and at her heart rate I think a beta-blocker could help to decrease the frequency of migrainous headaches.  She could also be a candidate for injection therapy every 30 days or for topiramate.  I would rather start with a beta-blocker in her case.  I will asked the patient to keep a headache diary or journal so that we see if beta-blockers indeed reduce the frequency and intensity of her migrainous headaches.  I do offer a MRI with and without contrast of the brain as she has never been fully evaluated for her migraine headaches and also because she still has had some lightheadedness that has persisted since the scopolamine incident.  It would also be helpful to have an exact look at her brainstem. She had nystagmus ain left and right direction.     My Plan is to proceed with:   1) MRI brain  2) propanolol as preventive.- 15 migraines a month,  3) No more scopolamine.  I would like to thank Rory Percy, MD and Rory Percy, [place],  [place] [address] for allowing me to meet with and to take care of this pleasant patient.   In short, Andrea Avery is presenting with scopolamine overdose.  What side effects may I notice from receiving this medicine? Side effects that you should report to your doctor or health care professional as soon as possible:  allergic reactions like skin rash, itching or hives; swelling of the face, lips, or tongue  blurred vision  changes in vision  confusion  dizziness  eye pain  fast, irregular heartbeat  hallucinations, loss of contact with reality  nausea, vomiting  pain or trouble passing urine  restlessness  seizures  skin irritation  stomach pain  drowsiness  dry mouth  headache  sore throat.    CC: I will share my notes with PCP .  Electronically signed by: Larey Seat, MD 11/19/2019 2:10 PM  [facility] Board certified by The [employer] of Sleep  Medicine and Diplomate of the [employer] of Sleep Medicine. Board certified In Neurology through the [employer], Fellow of the [employer] of Neurology. Medical Director of [facility].

## 2019-11-19 NOTE — Patient Instructions (Signed)
Scopolamine skin patches What is this medicine? SCOPOLAMINE (skoe POL a meen) is used to prevent nausea and vomiting caused by motion sickness, anesthesia and surgery. This medicine may be used for other purposes; ask your health care provider or pharmacist if you have questions. COMMON BRAND NAME(S): Transderm Scop What should I tell my health care provider before I take this medicine? They need to know if you have any of these conditions:  are scheduled to have a gastric secretion test  glaucoma  heart disease  kidney disease  liver disease  lung or breathing disease, like asthma  mental illness  prostate disease  seizures  stomach or intestine problems  trouble passing urine  an unusual or allergic reaction to scopolamine, atropine, other medicines, foods, dyes, or preservatives  pregnant or trying to get pregnant  breast-feeding How should I use this medicine? This medicine is for external use only. Follow the directions on the prescription label. Wear only 1 patch at a time. Choose an area behind the ear, that is clean, dry, hairless and free from any cuts or irritation. Wipe the area with a clean dry tissue. Peel off the plastic backing of the skin patch, trying not to touch the adhesive side with your hands. Do not cut the patches. Firmly apply to the area you have chosen, with the metallic side of the patch to the skin and the tan-colored side showing. Once firmly in place, wash your hands well with soap and water. Do not get this medicine into your eyes. After removing the patch, wash your hands and the area behind your ear thoroughly with soap and water. The patch will still contain some medicine after use. To avoid accidental contact or ingestion by children or pets, fold the used patch in half with the sticky side together and throw away in the trash out of the reach of children and pets. If you need to use a second patch after you remove the first, place it behind  the other ear. A special MedGuide will be given to you by the pharmacist with each prescription and refill. Be sure to read this information carefully each time. Talk to your pediatrician regarding the use of this medicine in children. Special care may be needed. Overdosage: If you think you have taken too much of this medicine contact a poison control center or emergency room at once. NOTE: This medicine is only for you. Do not share this medicine with others. What if I miss a dose? This does not apply. This medicine is not for regular use. What may interact with this medicine?  alcohol  antihistamines for allergy cough and cold  atropine  certain medicines for anxiety or sleep  certain medicines for bladder problems like oxybutynin, tolterodine  certain medicines for depression like amitriptyline, fluoxetine, sertraline  certain medicines for stomach problems like dicyclomine, hyoscyamine  certain medicines for Parkinson's disease like benztropine, trihexyphenidyl  certain medicines for seizures like phenobarbital, primidone  general anesthetics like halothane, isoflurane, methoxyflurane, propofol  ipratropium  local anesthetics like lidocaine, pramoxine, tetracaine  medicines that relax muscles for surgery  phenothiazines like chlorpromazine, mesoridazine, prochlorperazine, thioridazine  narcotic medicines for pain  other belladonna alkaloids This list may not describe all possible interactions. Give your health care provider a list of all the medicines, herbs, non-prescription drugs, or dietary supplements you use. Also tell them if you smoke, drink alcohol, or use illegal drugs. Some items may interact with your medicine. What should I watch for while using  this medicine? Limit contact with water while swimming and bathing because the patch may fall off. If the patch falls off, throw it away and put a new one behind the other ear. You may get drowsy or dizzy. Do not  drive, use machinery, or do anything that needs mental alertness until you know how this medicine affects you. Do not stand or sit up quickly, especially if you are an older patient. This reduces the risk of dizzy or fainting spells. Alcohol may interfere with the effect of this medicine. Avoid alcoholic drinks. Your mouth may get dry. Chewing sugarless gum or sucking hard candy, and drinking plenty of water may help. Contact your healthcare professional if the problem does not go away or is severe. This medicine may cause dry eyes and blurred vision. If you wear contact lenses, you may feel some discomfort. Lubricating drops may help. See your healthcare professional if the problem does not go away or is severe. If you are going to need surgery, an MRI, CT scan, or other procedure, tell your healthcare professional that you are using this medicine. You may need to remove the patch before the procedure. What side effects may I notice from receiving this medicine? Side effects that you should report to your doctor or health care professional as soon as possible:  allergic reactions like skin rash, itching or hives; swelling of the face, lips, or tongue  blurred vision  changes in vision  confusion  dizziness  eye pain  fast, irregular heartbeat  hallucinations, loss of contact with reality  nausea, vomiting  pain or trouble passing urine  restlessness  seizures  skin irritation  stomach pain Side effects that usually do not require medical attention (report to your doctor or health care professional if they continue or are bothersome):  drowsiness  dry mouth  headache  sore throat This list may not describe all possible side effects. Call your doctor for medical advice about side effects. You may report side effects to FDA at 1-800-FDA-1088. Where should I keep my medicine? Keep out of the reach of children. Store at room temperature between 20 and 25 degrees C (68 and 77  degrees F). Keep this medicine in the foil package until ready to use. Throw away any unused medicine after the expiration date. NOTE: This sheet is a summary. It may not cover all possible information. If you have questions about this medicine, talk to your doctor, pharmacist, or health care provider.  2020 Elsevier/Gold Standard (2017-05-19 16:14:46)

## 2019-11-20 ENCOUNTER — Telehealth: Payer: Self-pay | Admitting: Neurology

## 2019-11-20 NOTE — Telephone Encounter (Signed)
Medicare/[employer] auth: [employer] via [employer] website order sent to GI. They will reach out to the patient to schedule.  

## 2019-11-26 DIAGNOSIS — E782 Mixed hyperlipidemia: Secondary | ICD-10-CM | POA: Diagnosis not present

## 2019-11-26 DIAGNOSIS — E1165 Type 2 diabetes mellitus with hyperglycemia: Secondary | ICD-10-CM | POA: Diagnosis not present

## 2019-11-26 DIAGNOSIS — I10 Essential (primary) hypertension: Secondary | ICD-10-CM | POA: Diagnosis not present

## 2019-11-26 DIAGNOSIS — K219 Gastro-esophageal reflux disease without esophagitis: Secondary | ICD-10-CM | POA: Diagnosis not present

## 2019-11-28 ENCOUNTER — Ambulatory Visit
Admission: RE | Admit: 2019-11-28 | Discharge: 2019-11-28 | Disposition: A | Discharge status: Home / Self Care | Payer: Medicare Other | Source: Ambulatory Visit | Attending: Neurology | Admitting: Neurology

## 2019-11-28 ENCOUNTER — Other Ambulatory Visit: Payer: Self-pay

## 2019-11-28 DIAGNOSIS — T50901A Poisoning by unspecified drugs, medicaments and biological substances, accidental (unintentional), initial encounter: Secondary | ICD-10-CM

## 2019-11-28 DIAGNOSIS — F4489 Other dissociative and conversion disorders: Secondary | ICD-10-CM

## 2019-11-28 DIAGNOSIS — G43109 Migraine with aura, not intractable, without status migrainosus: Secondary | ICD-10-CM

## 2019-11-28 MED ORDER — GADOBENATE DIMEGLUMINE 529 MG/ML IV SOLN
11.0000 mL | Freq: Once | INTRAVENOUS | Status: AC | PRN
  Administered 2019-11-28: 11 mL via INTRAVENOUS

## 2019-11-29 DIAGNOSIS — E7801 Familial hypercholesterolemia: Secondary | ICD-10-CM | POA: Diagnosis not present

## 2019-11-29 DIAGNOSIS — R519 Headache, unspecified: Secondary | ICD-10-CM | POA: Diagnosis not present

## 2019-11-29 DIAGNOSIS — M545 Low back pain: Secondary | ICD-10-CM | POA: Diagnosis not present

## 2019-11-29 DIAGNOSIS — I10 Essential (primary) hypertension: Secondary | ICD-10-CM | POA: Diagnosis not present

## 2019-11-29 DIAGNOSIS — Z0000 Encounter for general adult medical examination without abnormal findings: Secondary | ICD-10-CM | POA: Diagnosis not present

## 2019-11-29 DIAGNOSIS — K219 Gastro-esophageal reflux disease without esophagitis: Secondary | ICD-10-CM | POA: Diagnosis not present

## 2019-11-29 DIAGNOSIS — Z6823 Body mass index (BMI) 23.0-23.9, adult: Secondary | ICD-10-CM | POA: Diagnosis not present

## 2019-12-02 ENCOUNTER — Telehealth: Payer: Self-pay | Admitting: Neurology

## 2019-12-02 NOTE — Telephone Encounter (Signed)
Called the patient and reviewed the normal MRI brain findings. Advised there was nothing new in comparison from previous images and advised the images were normal for her age. She verbalized understanding and had no further questions

## 2019-12-02 NOTE — Telephone Encounter (Signed)
-----   Message from Larey Seat, MD sent at 12/02/2019  8:58 AM EDT ----- IMPRESSION: This MRI of the brain with and without contrast shows the following: 1.   Mild generalized cortical atrophy, typical for age. 2.   Scattered T2/FLAIR hyperintense foci in the hemispheres consistent with chronic microvascular ischemic change.  None of the foci appear to be acute and they did not enhance.  The one focus noted on the CT scan from 11/09/2019 likely corresponds to one of the foci seen on the MRI.  It does not appear to be acute. 3.   No acute findings and normal enhancement pattern.     INTERPRETING PHYSICIAN:  Richard A. Sater, MD, PhD, Charlynn Grimes  All normal for age, no acute changes or changes in comparison to last image study.

## 2019-12-02 NOTE — Progress Notes (Signed)
IMPRESSION: This MRI of the brain with and without contrast shows the following: 1.   Mild generalized cortical atrophy, typical for age. 2.   Scattered T2/FLAIR hyperintense foci in the hemispheres consistent with chronic microvascular ischemic change.  None of the foci appear to be acute and they did not enhance.  The one focus noted on the CT scan from 11/09/2019 likely corresponds to one of the foci seen on the MRI.  It does not appear to be acute. 3.   No acute findings and normal enhancement pattern.     INTERPRETING PHYSICIAN:  Richard A. Sater, MD, PhD, Charlynn Grimes  All normal for age, no acute changes or changes in comparison to last image study.

## 2019-12-04 ENCOUNTER — Other Ambulatory Visit: Payer: Self-pay

## 2019-12-04 ENCOUNTER — Ambulatory Visit
Admission: RE | Admit: 2019-12-04 | Discharge: 2019-12-04 | Disposition: A | Discharge status: Home / Self Care | Payer: Medicare Other | Source: Ambulatory Visit | Attending: Family Medicine | Admitting: Family Medicine

## 2019-12-04 DIAGNOSIS — Z1231 Encounter for screening mammogram for malignant neoplasm of breast: Secondary | ICD-10-CM | POA: Diagnosis not present

## 2019-12-26 DIAGNOSIS — Z23 Encounter for immunization: Secondary | ICD-10-CM | POA: Diagnosis not present

## 2020-01-02 DIAGNOSIS — M79672 Pain in left foot: Secondary | ICD-10-CM | POA: Diagnosis not present

## 2020-01-02 DIAGNOSIS — Z6823 Body mass index (BMI) 23.0-23.9, adult: Secondary | ICD-10-CM | POA: Diagnosis not present

## 2020-01-12 IMAGING — MG DIGITAL SCREENING BILATERAL MAMMOGRAM WITH TOMO AND CAD
8 series · 9 of 24 positions shown · non-contrast
Comparison: Previous exam(s).

CLINICAL DATA: Screening.

EXAM:
DIGITAL SCREENING BILATERAL MAMMOGRAM WITH TOMO AND CAD

[R CC synth-2D]
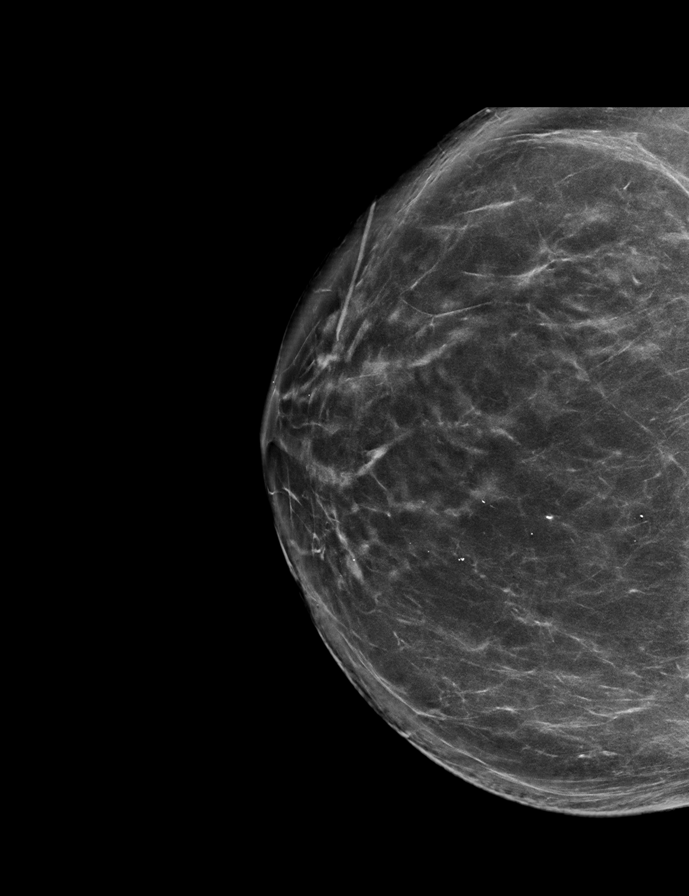

[L CC synth-2D]
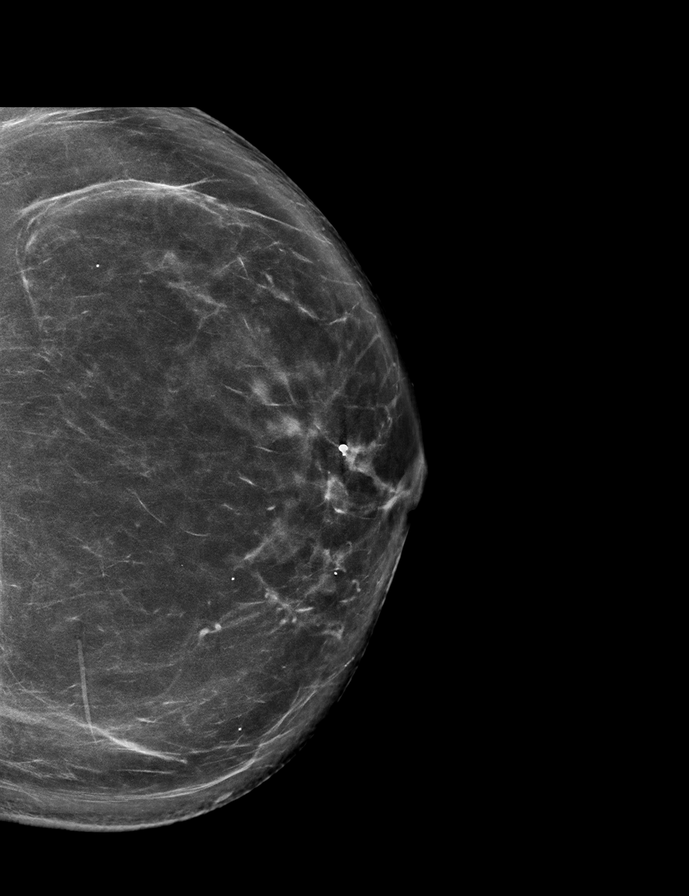

[R MLO synth-2D]
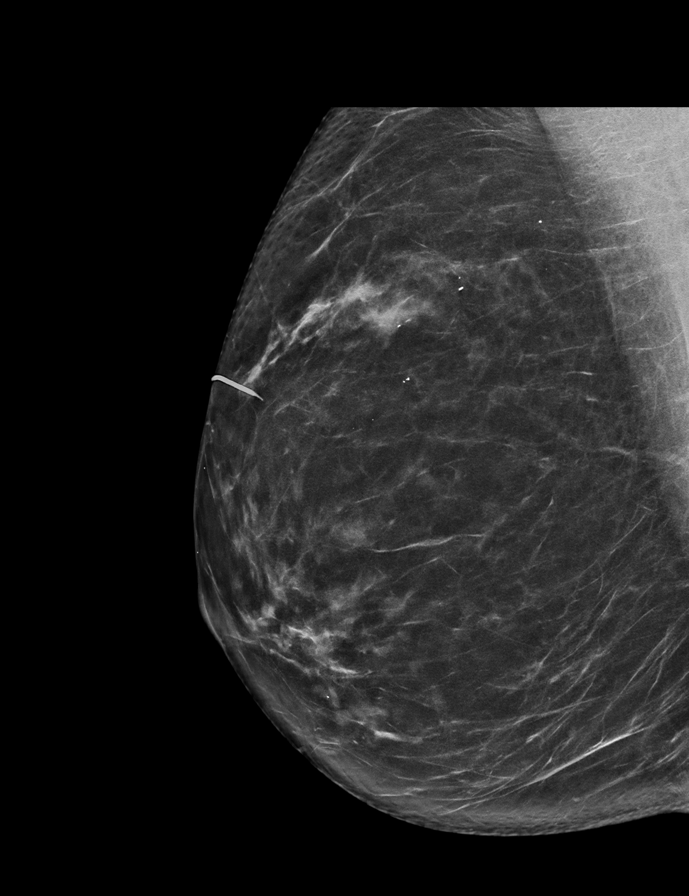

[L MLO synth-2D]
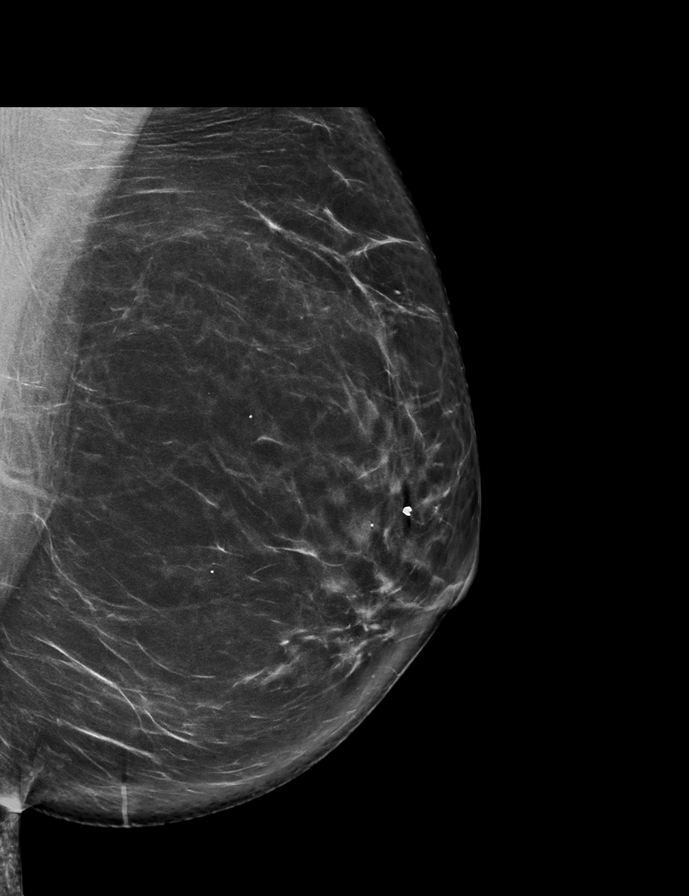

[R MLO tomo · 2 of 76 frames shown]
[frame 25/76]
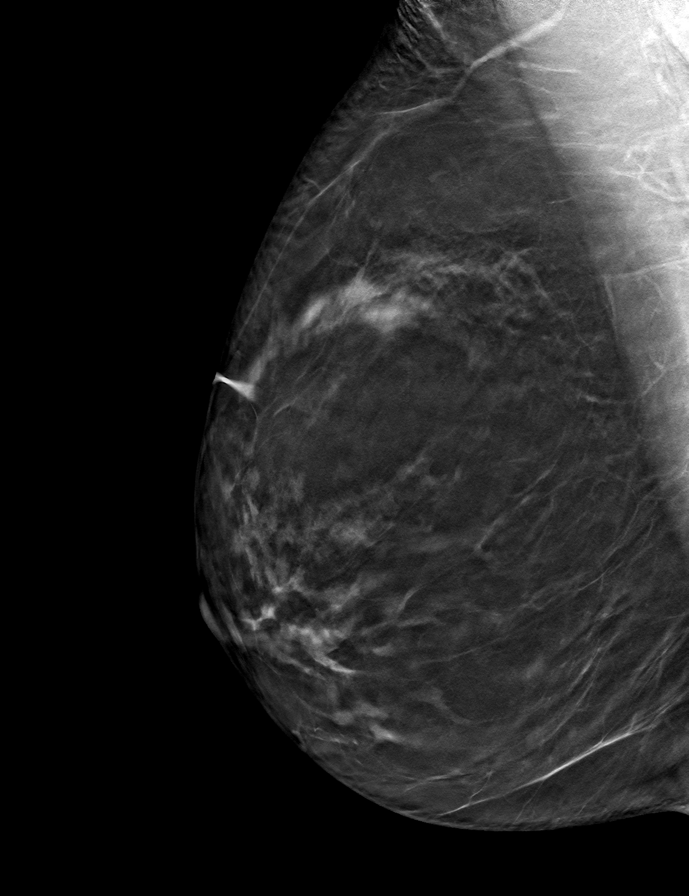
[frame 39/76]
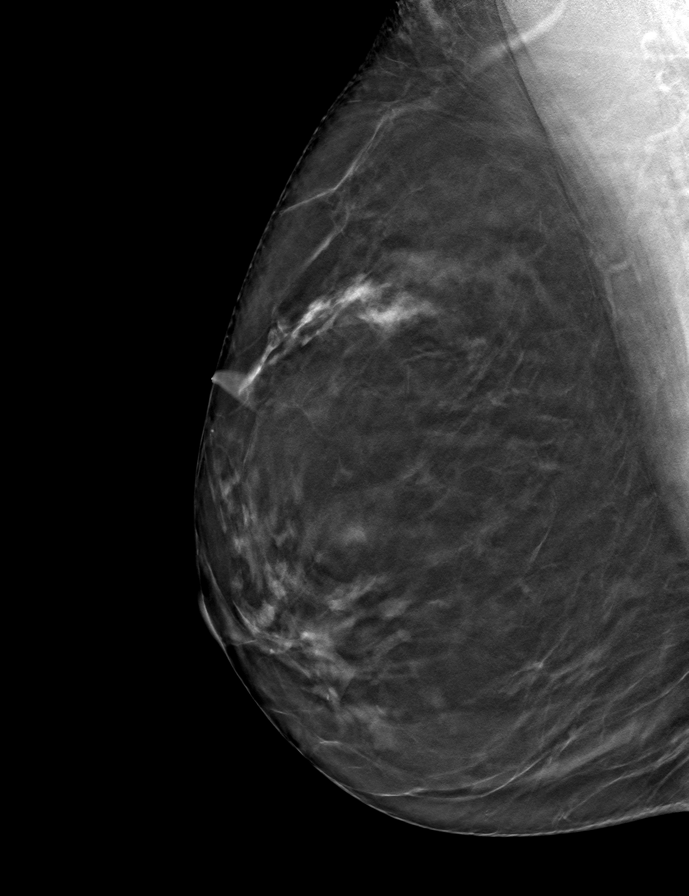

[L CC tomo · tomo slice 43/85.0]
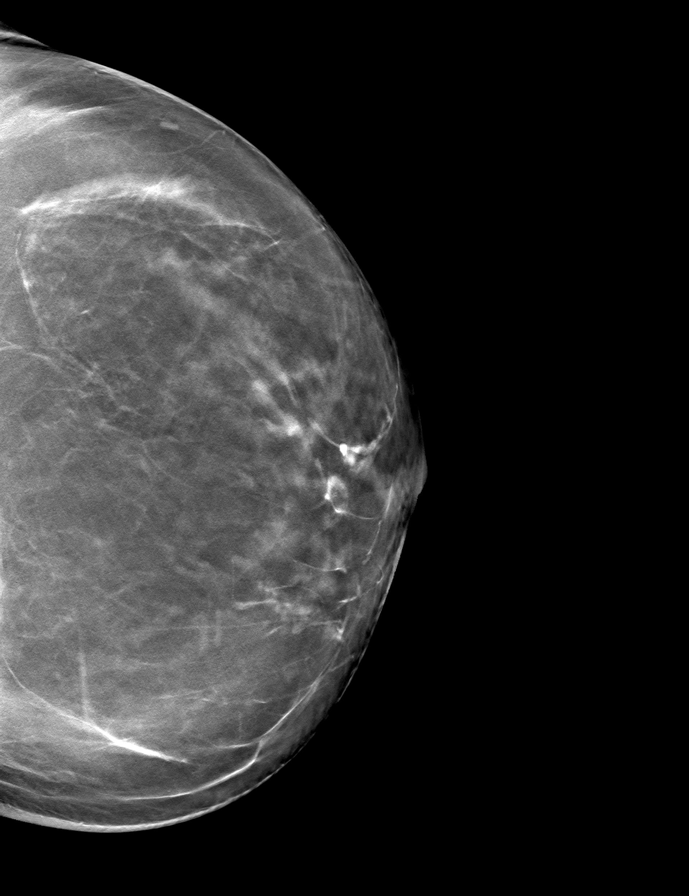

[R CC tomo · tomo slice 41/82.0]
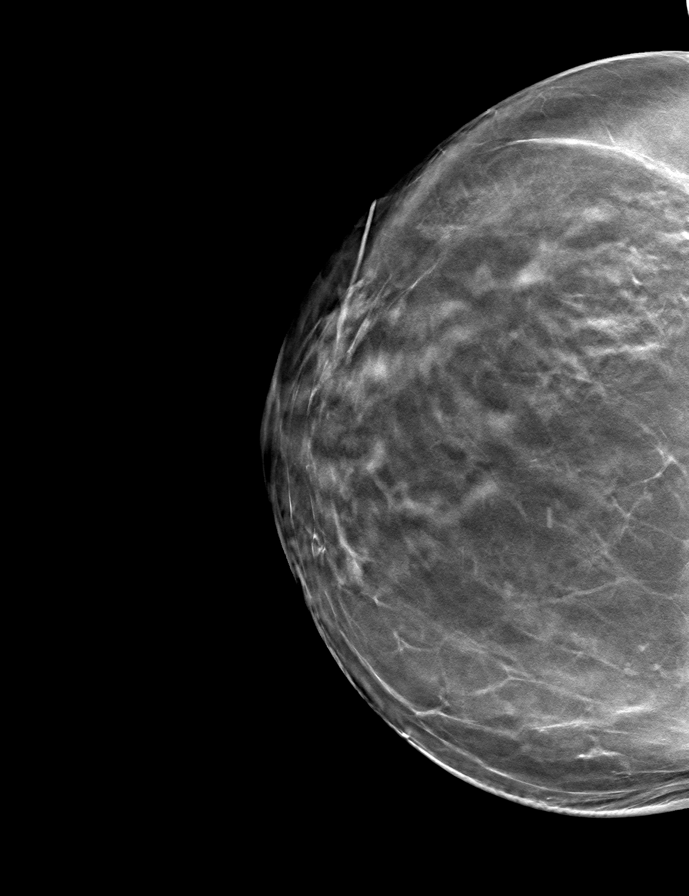

[L MLO tomo · tomo slice 41/80.0]
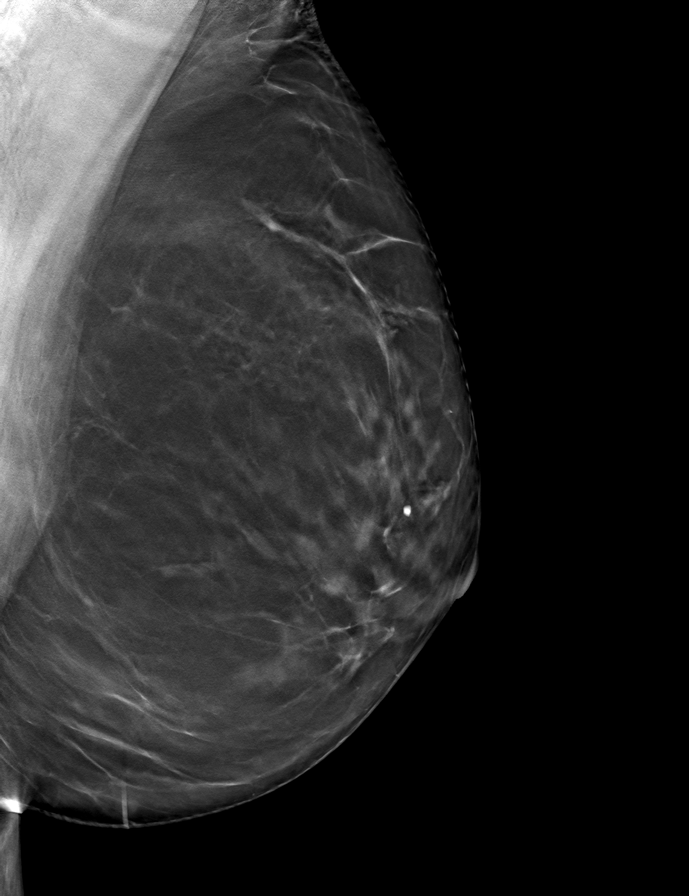

[9 of 24 positions shown; findings below may reference images not displayed]

ACR Breast Density Category b: There are scattered areas of
fibroglandular density.
FINDINGS: There are no findings suspicious for malignancy. Images were
processed with CAD.
IMPRESSION: No mammographic evidence of malignancy. A result letter of this
screening mammogram will be mailed directly to the patient.

RECOMMENDATION:
Screening mammogram in one year. (Code:[id number])

BI-RADS CATEGORY  1: Negative.

## 2020-01-22 DIAGNOSIS — D485 Neoplasm of uncertain behavior of skin: Secondary | ICD-10-CM | POA: Diagnosis not present

## 2020-01-22 DIAGNOSIS — D239 Other benign neoplasm of skin, unspecified: Secondary | ICD-10-CM | POA: Diagnosis not present

## 2020-01-22 DIAGNOSIS — L738 Other specified follicular disorders: Secondary | ICD-10-CM | POA: Diagnosis not present

## 2020-01-22 DIAGNOSIS — L820 Inflamed seborrheic keratosis: Secondary | ICD-10-CM | POA: Diagnosis not present

## 2020-01-28 DIAGNOSIS — Z23 Encounter for immunization: Secondary | ICD-10-CM | POA: Diagnosis not present

## 2020-02-24 ENCOUNTER — Ambulatory Visit: Payer: Medicare Other | Admitting: Family Medicine

## 2020-03-17 ENCOUNTER — Ambulatory Visit: Payer: Medicare Other | Admitting: Family Medicine

## 2020-04-03 DIAGNOSIS — R0902 Hypoxemia: Secondary | ICD-10-CM | POA: Diagnosis not present

## 2020-04-03 DIAGNOSIS — R030 Elevated blood-pressure reading, without diagnosis of hypertension: Secondary | ICD-10-CM | POA: Diagnosis not present

## 2020-04-03 DIAGNOSIS — R231 Pallor: Secondary | ICD-10-CM | POA: Diagnosis not present

## 2020-04-03 DIAGNOSIS — I10 Essential (primary) hypertension: Secondary | ICD-10-CM | POA: Diagnosis not present

## 2020-04-03 DIAGNOSIS — R55 Syncope and collapse: Secondary | ICD-10-CM | POA: Diagnosis not present

## 2020-04-03 DIAGNOSIS — Z20822 Contact with and (suspected) exposure to covid-19: Secondary | ICD-10-CM | POA: Diagnosis not present

## 2020-04-03 DIAGNOSIS — R531 Weakness: Secondary | ICD-10-CM | POA: Diagnosis not present

## 2020-04-03 DIAGNOSIS — I959 Hypotension, unspecified: Secondary | ICD-10-CM | POA: Diagnosis not present

## 2020-04-07 DIAGNOSIS — Z6823 Body mass index (BMI) 23.0-23.9, adult: Secondary | ICD-10-CM | POA: Diagnosis not present

## 2020-04-07 DIAGNOSIS — R55 Syncope and collapse: Secondary | ICD-10-CM | POA: Diagnosis not present

## 2020-05-13 ENCOUNTER — Other Ambulatory Visit: Payer: Self-pay | Admitting: Neurology

## 2020-05-18 ENCOUNTER — Other Ambulatory Visit: Payer: Self-pay | Admitting: Neurological Surgery

## 2020-05-19 ENCOUNTER — Other Ambulatory Visit: Payer: Self-pay | Admitting: Neurological Surgery

## 2020-05-19 DIAGNOSIS — M5416 Radiculopathy, lumbar region: Secondary | ICD-10-CM

## 2020-05-20 ENCOUNTER — Ambulatory Visit
Admission: RE | Admit: 2020-05-20 | Discharge: 2020-05-20 | Disposition: A | Discharge status: Home / Self Care | Payer: Medicare Other | Source: Ambulatory Visit | Attending: Neurological Surgery | Admitting: Neurological Surgery

## 2020-05-20 ENCOUNTER — Other Ambulatory Visit: Payer: Self-pay

## 2020-05-20 ENCOUNTER — Encounter: Payer: Self-pay | Admitting: *Deleted

## 2020-05-20 DIAGNOSIS — M5416 Radiculopathy, lumbar region: Secondary | ICD-10-CM

## 2020-05-20 MED ORDER — METHYLPREDNISOLONE ACETATE 40 MG/ML INJ SUSP (RADIOLOG
120.0000 mg | Freq: Once | INTRAMUSCULAR | Status: AC
  Administered 2020-05-20: 120 mg via EPIDURAL

## 2020-05-20 MED ORDER — IOPAMIDOL (ISOVUE-M 200) INJECTION 41%
1.0000 mL | Freq: Once | INTRAMUSCULAR | Status: AC
  Administered 2020-05-20: 1 mL via EPIDURAL

## 2020-05-20 NOTE — Discharge Instructions (Signed)

## 2020-05-21 ENCOUNTER — Encounter: Payer: Self-pay | Admitting: Cardiology

## 2020-05-21 ENCOUNTER — Ambulatory Visit (INDEPENDENT_AMBULATORY_CARE_PROVIDER_SITE_OTHER): Payer: Medicare Other | Admitting: Cardiology

## 2020-05-21 VITALS — BP 132/82 | HR 78 | Ht 62.0 in | Wt 131.0 lb

## 2020-05-21 DIAGNOSIS — R55 Syncope and collapse: Secondary | ICD-10-CM

## 2020-05-21 NOTE — Progress Notes (Signed)
Clinical Summary Andrea Avery is a 70 y.o.female seen as new consult, referred by Dr Grandville Silos for the following medical problems.  1. Near syncope - 3 episodes over the last few months   - first episode after leaving the gym. Was walking inside [employer]. Felt lighthead, dizzy. Sat down for about 15 minutes, felt better and went home.   -second episode walking at track outdoors. Mild warm temp, walking fast walk. Similar episode, sat down and rested.   - third episode at the gym. Had finished weights and treadmill, similar episodes.  - 911 was called, taken to [facility]. She recalls her bp was a little bit low at that time, was told she was dehydrated and given fluids. No recurrent episodes since Er visit.    - 10 point decline in DBP with sitting, standing bp's were normal - water bottle infrequent, orange juice or mild 1 glass per day, caffeine pepsi x small bottle  - 07/2019 echo at [facility] as reported below was benign overall    Past Medical History:  Diagnosis Date  . Acid reflux   . High cholesterol      Allergies  Allergen Reactions  . Codeine   . Prednisone   . Statins      Current Outpatient Medications  Medication Sig Dispense Refill  . hydrocortisone (ANUSOL-HC) 2.5 % rectal cream Apply 1 application topically 2 (two) times daily.    Marland Kitchen ibuprofen (ADVIL) 600 MG tablet TAKE 1 TABLET BY MOUTH EVERY 6 TO 8 HOURS AS NEEDED FOR PAIN    . propranolol (INDERAL) 20 MG tablet TAKE 1 TABLET BY MOUTH TWICE A DAY 180 tablet 0  . rosuvastatin (CRESTOR) 10 MG tablet Take 10 mg by mouth at bedtime.     No current facility-administered medications for this visit.     Past Surgical History:  Procedure Laterality Date  . BREAST EXCISIONAL BIOPSY Right   . EXCISION OF BREAST BIOPSY Left    benign 09/2017 [place]     Allergies  Allergen Reactions  . Codeine   . Prednisone   . Statins       Family History  Problem Relation Age of Onset  . Diabetes Mother    . Liver disease Mother      Social History Ms. Sorber reports that she has never smoked. She has never used smokeless tobacco. Ms. Labombard reports previous alcohol use.   Review of Systems CONSTITUTIONAL: No weight loss, fever, chills, weakness or fatigue.  HEENT: Eyes: No visual loss, blurred vision, double vision or yellow sclerae.No hearing loss, sneezing, congestion, runny nose or sore throat.  SKIN: No rash or itching.  CARDIOVASCULAR: per hpi RESPIRATORY: No shortness of breath, cough or sputum.  GASTROINTESTINAL: No anorexia, nausea, vomiting or diarrhea. No abdominal pain or blood.  GENITOURINARY: No burning on urination, no polyuria NEUROLOGICAL: No headache, dizziness, syncope, paralysis, ataxia, numbness or tingling in the extremities. No change in bowel or bladder control.  MUSCULOSKELETAL: No muscle, back pain, joint pain or stiffness.  LYMPHATICS: No enlarged nodes. No history of splenectomy.  PSYCHIATRIC: No history of depression or anxiety.  ENDOCRINOLOGIC: No reports of sweating, cold or heat intolerance. No polyuria or polydipsia.  Marland Kitchen   Physical Examination Today's Vitals   05/21/20 1256  BP: 132/82  Pulse: 78  SpO2: 96%  Weight: 131 lb (59.4 kg)  Height: 5\' 2"  (1.575 m)   Body mass index is 23.96 kg/m.  Gen: resting comfortably, no acute distress HEENT: no  scleral icterus, pupils equal round and reactive, no palptable cervical adenopathy,  CV: RRR, no m/r/g, no jvd Resp: Clear to auscultation bilaterally GI: abdomen is soft, non-tender, non-distended, normal bowel sounds, no hepatosplenomegaly MSK: extremities are warm, no edema.  Skin: warm, no rash Neuro:  no focal deficits Psych: appropriate affect   Diagnostic Studies  07/2019 echo Summary  1. The left ventricle is normal in size with normal wall thickness.  2. The left ventricular systolic function is normal, LVEF is visually  estimated at 55-60%.  3. There is grade I diastolic  dysfunction(impaired relaxation).  4. There is mild mitral valve regurgitation.  5. The left atrium is mildly dilated in size.  6. The right ventricle is normal in size, with normal systolic function.    Assessment and Plan  1. Near syncope - 3 episodes over the last few months, last episode 1.5 months ago - all 3 episodes occurred just after or while working out.  - limited oral hydration. Borderline orthostatic by DBP today. She reports she was told she was dehydated at her ER evaluation. Structurally normal heart by echo last year - suspect symptoms are related to dehydration/orthostasis. Discussed aggressive hydration particularly on work out days, if recurrence with doing this then would plan for a heart monitor - EKG today shows NSR  F/u 6 months.       Arnoldo Lenis, M.D.

## 2020-05-21 NOTE — Patient Instructions (Signed)
Your physician recommends that you schedule a follow-up appointment in: 6 MONTHS WITH DR BRANCH  Your physician recommends that you continue on your current medications as directed. Please refer to the Current Medication list given to you today.  Thank you for choosing [facility]!!    

## 2020-06-09 DIAGNOSIS — H40013 Open angle with borderline findings, low risk, bilateral: Secondary | ICD-10-CM | POA: Diagnosis not present

## 2020-08-09 ENCOUNTER — Other Ambulatory Visit: Payer: Self-pay | Admitting: Neurology

## 2020-09-08 DIAGNOSIS — M436 Torticollis: Secondary | ICD-10-CM | POA: Diagnosis not present

## 2020-09-21 DIAGNOSIS — M542 Cervicalgia: Secondary | ICD-10-CM | POA: Diagnosis not present

## 2020-11-19 ENCOUNTER — Other Ambulatory Visit: Payer: Self-pay | Admitting: Family Medicine

## 2020-11-19 DIAGNOSIS — Z1231 Encounter for screening mammogram for malignant neoplasm of breast: Secondary | ICD-10-CM

## 2020-12-02 DIAGNOSIS — E039 Hypothyroidism, unspecified: Secondary | ICD-10-CM | POA: Diagnosis not present

## 2020-12-02 DIAGNOSIS — E782 Mixed hyperlipidemia: Secondary | ICD-10-CM | POA: Diagnosis not present

## 2020-12-02 DIAGNOSIS — E1165 Type 2 diabetes mellitus with hyperglycemia: Secondary | ICD-10-CM | POA: Diagnosis not present

## 2020-12-02 DIAGNOSIS — K219 Gastro-esophageal reflux disease without esophagitis: Secondary | ICD-10-CM | POA: Diagnosis not present

## 2020-12-02 DIAGNOSIS — E7849 Other hyperlipidemia: Secondary | ICD-10-CM | POA: Diagnosis not present

## 2020-12-07 ENCOUNTER — Ambulatory Visit (INDEPENDENT_AMBULATORY_CARE_PROVIDER_SITE_OTHER): Payer: Medicare Other | Admitting: Cardiology

## 2020-12-07 ENCOUNTER — Encounter: Payer: Self-pay | Admitting: Cardiology

## 2020-12-07 VITALS — BP 116/80 | HR 84 | Ht 62.0 in | Wt 129.2 lb

## 2020-12-07 DIAGNOSIS — R55 Syncope and collapse: Secondary | ICD-10-CM

## 2020-12-07 NOTE — Progress Notes (Signed)
Clinical Summary Andrea Avery is a 70 y.o.female seen today for follow up of the following medical problems.   1. Near syncope - at last visit reported 3 episodes over the last few months of near syncope   - first episode after leaving the gym. Was walking inside [employer]. Felt lighthead, dizzy. Sat down for about 15 minutes, felt better and went home.   -second episode walking at track outdoors. Mild warm temp, walking fast walk. Similar episode, sat down and rested.   - third episode at the gym. Had finished weights and treadmill, similar episodes.  - 911 was called, taken to [facility]. She recalls her bp was a little bit low at that time, was told she was dehydrated and given fluids. No recurrent episodes since Er visit.  - 10 point decline in DBP with sitting, standing bp's were normal - water bottle infrequent, orange juice or mild 1 glass per day, caffeine pepsi x small bottle  - 07/2019 echo at [facility] as reported below was benign overall   - no recurrent symptoms since last visit. Workign to maintain hydration   Past Medical History:  Diagnosis Date   Acid reflux    High cholesterol      Allergies  Allergen Reactions   Codeine    Prednisone    Statins      Current Outpatient Medications  Medication Sig Dispense Refill   ibuprofen (ADVIL) 600 MG tablet TAKE 1 TABLET BY MOUTH EVERY 6 TO 8 HOURS AS NEEDED FOR PAIN     pantoprazole (PROTONIX) 40 MG tablet Take 40 mg by mouth daily.     simvastatin (ZOCOR) 20 MG tablet Take 20 mg by mouth daily.     No current facility-administered medications for this visit.     Past Surgical History:  Procedure Laterality Date   BREAST EXCISIONAL BIOPSY Right    EXCISION OF BREAST BIOPSY Left    benign 09/2017 [place]     Allergies  Allergen Reactions   Codeine    Prednisone    Statins       Family History  Problem Relation Age of Onset   Diabetes Mother    Liver disease Mother      Social History Andrea Avery reports that she has never smoked. She has never used smokeless tobacco. Andrea Avery reports that she does not currently use alcohol.   Review of Systems CONSTITUTIONAL: No weight loss, fever, chills, weakness or fatigue.  HEENT: Eyes: No visual loss, blurred vision, double vision or yellow sclerae.No hearing loss, sneezing, congestion, runny nose or sore throat.  SKIN: No rash or itching.  CARDIOVASCULAR: per hpi RESPIRATORY: No shortness of breath, cough or sputum.  GASTROINTESTINAL: No anorexia, nausea, vomiting or diarrhea. No abdominal pain or blood.  GENITOURINARY: No burning on urination, no polyuria NEUROLOGICAL: No headache, dizziness, syncope, paralysis, ataxia, numbness or tingling in the extremities. No change in bowel or bladder control.  MUSCULOSKELETAL: No muscle, back pain, joint pain or stiffness.  LYMPHATICS: No enlarged nodes. No history of splenectomy.  PSYCHIATRIC: No history of depression or anxiety.  ENDOCRINOLOGIC: No reports of sweating, cold or heat intolerance. No polyuria or polydipsia.  Marland Kitchen   Physical Examination Today's Vitals   12/07/20 0925  BP: 116/80  Pulse: 84  SpO2: 99%  Weight: 129 lb 3.2 oz (58.6 kg)  Height: 5\' 2"  (1.575 m)   Body mass index is 23.63 kg/m.  Gen: resting comfortably, no acute distress HEENT: no scleral  icterus, pupils equal round and reactive, no palptable cervical adenopathy,  CV: RRR, no m/r/g no jvd Resp: Clear to auscultation bilaterally GI: abdomen is soft, non-tender, non-distended, normal bowel sounds, no hepatosplenomegaly MSK: extremities are warm, no edema.  Skin: warm, no rash Neuro:  no focal deficits Psych: appropriate affect   Diagnostic Studies 07/2019 echo Summary    1. The left ventricle is normal in size with normal wall thickness.    2. The left ventricular systolic function is normal, LVEF is visually  estimated at 55-60%.    3. There is grade I diastolic dysfunction (impaired relaxation).     4. There is mild mitral valve regurgitation.    5. The left atrium is mildly dilated in size.    6. The right ventricle is normal in size, with normal systolic function.     Assessment and Plan  1. Near syncope - suspect orthostatic near syncope/dizziness - symptoms have resolved with increased hydration - if recurrence consider outpatient cardiac monitor   F/u just as needed.       Arnoldo Lenis, M.D

## 2020-12-07 NOTE — Patient Instructions (Signed)
Medication Instructions:  Continue all current medications.  Labwork: none  Testing/Procedures: none  Follow-Up: As needed.    Any Other Special Instructions Will Be Listed Below (If Applicable).  If you need a refill on your cardiac medications before your next appointment, please call your pharmacy.  

## 2020-12-11 DIAGNOSIS — Z0001 Encounter for general adult medical examination with abnormal findings: Secondary | ICD-10-CM | POA: Diagnosis not present

## 2020-12-11 DIAGNOSIS — R7401 Elevation of levels of liver transaminase levels: Secondary | ICD-10-CM | POA: Diagnosis not present

## 2020-12-11 DIAGNOSIS — Z6823 Body mass index (BMI) 23.0-23.9, adult: Secondary | ICD-10-CM | POA: Diagnosis not present

## 2020-12-11 DIAGNOSIS — D7289 Other specified disorders of white blood cells: Secondary | ICD-10-CM | POA: Diagnosis not present

## 2020-12-11 DIAGNOSIS — F17210 Nicotine dependence, cigarettes, uncomplicated: Secondary | ICD-10-CM | POA: Diagnosis not present

## 2020-12-11 DIAGNOSIS — E039 Hypothyroidism, unspecified: Secondary | ICD-10-CM | POA: Diagnosis not present

## 2020-12-11 DIAGNOSIS — E7849 Other hyperlipidemia: Secondary | ICD-10-CM | POA: Diagnosis not present

## 2020-12-11 DIAGNOSIS — I10 Essential (primary) hypertension: Secondary | ICD-10-CM | POA: Diagnosis not present

## 2020-12-11 DIAGNOSIS — E1165 Type 2 diabetes mellitus with hyperglycemia: Secondary | ICD-10-CM | POA: Diagnosis not present

## 2020-12-31 ENCOUNTER — Ambulatory Visit
Admission: RE | Admit: 2020-12-31 | Discharge: 2020-12-31 | Disposition: A | Discharge status: Home / Self Care | Payer: Medicare Other | Source: Ambulatory Visit | Attending: Family Medicine | Admitting: Family Medicine

## 2020-12-31 ENCOUNTER — Other Ambulatory Visit: Payer: Self-pay

## 2020-12-31 DIAGNOSIS — Z1231 Encounter for screening mammogram for malignant neoplasm of breast: Secondary | ICD-10-CM | POA: Diagnosis not present

## 2021-01-01 DIAGNOSIS — Z13820 Encounter for screening for osteoporosis: Secondary | ICD-10-CM | POA: Diagnosis not present

## 2021-01-01 DIAGNOSIS — R232 Flushing: Secondary | ICD-10-CM | POA: Diagnosis not present

## 2021-01-01 DIAGNOSIS — Z23 Encounter for immunization: Secondary | ICD-10-CM | POA: Diagnosis not present

## 2021-01-01 DIAGNOSIS — Z1212 Encounter for screening for malignant neoplasm of rectum: Secondary | ICD-10-CM | POA: Diagnosis not present

## 2021-01-18 DIAGNOSIS — Z1211 Encounter for screening for malignant neoplasm of colon: Secondary | ICD-10-CM | POA: Diagnosis not present

## 2021-01-23 LAB — COLOGUARD: COLOGUARD: NEGATIVE

## 2021-04-05 DIAGNOSIS — N959 Unspecified menopausal and perimenopausal disorder: Secondary | ICD-10-CM | POA: Diagnosis not present

## 2021-04-05 DIAGNOSIS — L218 Other seborrheic dermatitis: Secondary | ICD-10-CM | POA: Diagnosis not present

## 2021-04-05 DIAGNOSIS — L719 Rosacea, unspecified: Secondary | ICD-10-CM | POA: Diagnosis not present

## 2021-05-19 ENCOUNTER — Other Ambulatory Visit: Payer: Self-pay | Admitting: Neurological Surgery

## 2021-05-19 DIAGNOSIS — M5416 Radiculopathy, lumbar region: Secondary | ICD-10-CM

## 2021-05-19 DIAGNOSIS — R030 Elevated blood-pressure reading, without diagnosis of hypertension: Secondary | ICD-10-CM | POA: Diagnosis not present

## 2021-05-20 ENCOUNTER — Ambulatory Visit
Admission: RE | Admit: 2021-05-20 | Discharge: 2021-05-20 | Disposition: A | Discharge status: Home / Self Care | Payer: Medicare Other | Source: Ambulatory Visit | Attending: Neurological Surgery | Admitting: Neurological Surgery

## 2021-05-20 ENCOUNTER — Other Ambulatory Visit: Payer: Self-pay

## 2021-05-20 DIAGNOSIS — M5416 Radiculopathy, lumbar region: Secondary | ICD-10-CM

## 2021-05-20 MED ORDER — METHYLPREDNISOLONE ACETATE 40 MG/ML INJ SUSP (RADIOLOG
80.0000 mg | Freq: Once | INTRAMUSCULAR | Status: AC
  Administered 2021-05-20: 80 mg via EPIDURAL

## 2021-05-20 MED ORDER — IOPAMIDOL (ISOVUE-M 200) INJECTION 41%
1.0000 mL | Freq: Once | INTRAMUSCULAR | Status: AC
  Administered 2021-05-20: 1 mL via EPIDURAL

## 2021-05-20 NOTE — Discharge Instructions (Signed)

## 2021-05-25 DIAGNOSIS — U071 COVID-19: Secondary | ICD-10-CM | POA: Diagnosis not present

## 2021-10-12 DIAGNOSIS — M810 Age-related osteoporosis without current pathological fracture: Secondary | ICD-10-CM | POA: Diagnosis not present

## 2021-10-12 DIAGNOSIS — M8589 Other specified disorders of bone density and structure, multiple sites: Secondary | ICD-10-CM | POA: Diagnosis not present

## 2021-10-23 DIAGNOSIS — L237 Allergic contact dermatitis due to plants, except food: Secondary | ICD-10-CM | POA: Diagnosis not present

## 2021-10-23 DIAGNOSIS — Z6824 Body mass index (BMI) 24.0-24.9, adult: Secondary | ICD-10-CM | POA: Diagnosis not present

## 2021-11-17 ENCOUNTER — Other Ambulatory Visit: Payer: Self-pay | Admitting: Family Medicine

## 2021-11-17 ENCOUNTER — Other Ambulatory Visit: Payer: Self-pay | Admitting: Internal Medicine

## 2021-11-17 DIAGNOSIS — Z1231 Encounter for screening mammogram for malignant neoplasm of breast: Secondary | ICD-10-CM

## 2021-12-30 DIAGNOSIS — R7401 Elevation of levels of liver transaminase levels: Secondary | ICD-10-CM | POA: Diagnosis not present

## 2021-12-30 DIAGNOSIS — K219 Gastro-esophageal reflux disease without esophagitis: Secondary | ICD-10-CM | POA: Diagnosis not present

## 2021-12-30 DIAGNOSIS — E7849 Other hyperlipidemia: Secondary | ICD-10-CM | POA: Diagnosis not present

## 2021-12-30 DIAGNOSIS — E1165 Type 2 diabetes mellitus with hyperglycemia: Secondary | ICD-10-CM | POA: Diagnosis not present

## 2021-12-30 DIAGNOSIS — E7801 Familial hypercholesterolemia: Secondary | ICD-10-CM | POA: Diagnosis not present

## 2022-01-04 DIAGNOSIS — Z23 Encounter for immunization: Secondary | ICD-10-CM | POA: Diagnosis not present

## 2022-01-04 DIAGNOSIS — R519 Headache, unspecified: Secondary | ICD-10-CM | POA: Diagnosis not present

## 2022-01-04 DIAGNOSIS — Z6824 Body mass index (BMI) 24.0-24.9, adult: Secondary | ICD-10-CM | POA: Diagnosis not present

## 2022-01-04 DIAGNOSIS — E7801 Familial hypercholesterolemia: Secondary | ICD-10-CM | POA: Diagnosis not present

## 2022-01-04 DIAGNOSIS — M545 Low back pain, unspecified: Secondary | ICD-10-CM | POA: Diagnosis not present

## 2022-01-04 DIAGNOSIS — R030 Elevated blood-pressure reading, without diagnosis of hypertension: Secondary | ICD-10-CM | POA: Diagnosis not present

## 2022-01-04 DIAGNOSIS — K219 Gastro-esophageal reflux disease without esophagitis: Secondary | ICD-10-CM | POA: Diagnosis not present

## 2022-01-12 DIAGNOSIS — K219 Gastro-esophageal reflux disease without esophagitis: Secondary | ICD-10-CM | POA: Diagnosis not present

## 2022-01-12 DIAGNOSIS — I10 Essential (primary) hypertension: Secondary | ICD-10-CM | POA: Diagnosis not present

## 2022-01-12 DIAGNOSIS — M545 Low back pain, unspecified: Secondary | ICD-10-CM | POA: Diagnosis not present

## 2022-01-12 DIAGNOSIS — F419 Anxiety disorder, unspecified: Secondary | ICD-10-CM | POA: Diagnosis not present

## 2022-01-12 DIAGNOSIS — R519 Headache, unspecified: Secondary | ICD-10-CM | POA: Diagnosis not present

## 2022-01-12 DIAGNOSIS — Z0001 Encounter for general adult medical examination with abnormal findings: Secondary | ICD-10-CM | POA: Diagnosis not present

## 2022-01-12 DIAGNOSIS — Z23 Encounter for immunization: Secondary | ICD-10-CM | POA: Diagnosis not present

## 2022-01-12 DIAGNOSIS — E7801 Familial hypercholesterolemia: Secondary | ICD-10-CM | POA: Diagnosis not present

## 2022-01-12 DIAGNOSIS — Z6824 Body mass index (BMI) 24.0-24.9, adult: Secondary | ICD-10-CM | POA: Diagnosis not present

## 2022-01-18 ENCOUNTER — Ambulatory Visit
Admission: RE | Admit: 2022-01-18 | Discharge: 2022-01-18 | Disposition: A | Discharge status: Home / Self Care | Payer: Medicare Other | Source: Ambulatory Visit | Attending: Internal Medicine | Admitting: Internal Medicine

## 2022-01-18 DIAGNOSIS — Z1231 Encounter for screening mammogram for malignant neoplasm of breast: Secondary | ICD-10-CM

## 2022-01-20 ENCOUNTER — Other Ambulatory Visit: Payer: Self-pay | Admitting: Internal Medicine

## 2022-01-20 DIAGNOSIS — R928 Other abnormal and inconclusive findings on diagnostic imaging of breast: Secondary | ICD-10-CM

## 2022-01-26 ENCOUNTER — Ambulatory Visit
Admission: RE | Admit: 2022-01-26 | Discharge: 2022-01-26 | Disposition: A | Discharge status: Home / Self Care | Payer: Medicare Other | Source: Ambulatory Visit | Attending: Internal Medicine | Admitting: Internal Medicine

## 2022-01-26 DIAGNOSIS — R928 Other abnormal and inconclusive findings on diagnostic imaging of breast: Secondary | ICD-10-CM

## 2022-01-26 DIAGNOSIS — R92321 Mammographic fibroglandular density, right breast: Secondary | ICD-10-CM | POA: Diagnosis not present

## 2022-01-26 DIAGNOSIS — N6489 Other specified disorders of breast: Secondary | ICD-10-CM | POA: Diagnosis not present

## 2022-02-09 ENCOUNTER — Encounter: Payer: Medicare Other | Admitting: Obstetrics & Gynecology

## 2022-03-02 DIAGNOSIS — R519 Headache, unspecified: Secondary | ICD-10-CM | POA: Diagnosis not present

## 2022-03-02 DIAGNOSIS — F419 Anxiety disorder, unspecified: Secondary | ICD-10-CM | POA: Diagnosis not present

## 2022-03-02 DIAGNOSIS — R030 Elevated blood-pressure reading, without diagnosis of hypertension: Secondary | ICD-10-CM | POA: Diagnosis not present

## 2022-03-02 DIAGNOSIS — Z6825 Body mass index (BMI) 25.0-25.9, adult: Secondary | ICD-10-CM | POA: Diagnosis not present

## 2022-03-02 DIAGNOSIS — K219 Gastro-esophageal reflux disease without esophagitis: Secondary | ICD-10-CM | POA: Diagnosis not present

## 2022-03-02 DIAGNOSIS — E7801 Familial hypercholesterolemia: Secondary | ICD-10-CM | POA: Diagnosis not present

## 2022-03-22 DIAGNOSIS — Z1159 Encounter for screening for other viral diseases: Secondary | ICD-10-CM | POA: Diagnosis not present

## 2022-03-22 DIAGNOSIS — E7849 Other hyperlipidemia: Secondary | ICD-10-CM | POA: Diagnosis not present

## 2022-03-22 DIAGNOSIS — E1165 Type 2 diabetes mellitus with hyperglycemia: Secondary | ICD-10-CM | POA: Diagnosis not present

## 2022-06-14 DIAGNOSIS — H43813 Vitreous degeneration, bilateral: Secondary | ICD-10-CM | POA: Diagnosis not present

## 2022-06-27 DIAGNOSIS — L738 Other specified follicular disorders: Secondary | ICD-10-CM | POA: Diagnosis not present

## 2022-07-06 DIAGNOSIS — R519 Headache, unspecified: Secondary | ICD-10-CM | POA: Diagnosis not present

## 2022-07-06 DIAGNOSIS — E7801 Familial hypercholesterolemia: Secondary | ICD-10-CM | POA: Diagnosis not present

## 2022-07-06 DIAGNOSIS — F419 Anxiety disorder, unspecified: Secondary | ICD-10-CM | POA: Diagnosis not present

## 2022-07-06 DIAGNOSIS — M545 Low back pain, unspecified: Secondary | ICD-10-CM | POA: Diagnosis not present

## 2022-07-06 DIAGNOSIS — R030 Elevated blood-pressure reading, without diagnosis of hypertension: Secondary | ICD-10-CM | POA: Diagnosis not present

## 2022-07-06 DIAGNOSIS — Z6824 Body mass index (BMI) 24.0-24.9, adult: Secondary | ICD-10-CM | POA: Diagnosis not present

## 2022-07-06 DIAGNOSIS — K219 Gastro-esophageal reflux disease without esophagitis: Secondary | ICD-10-CM | POA: Diagnosis not present

## 2022-08-03 DIAGNOSIS — E7801 Familial hypercholesterolemia: Secondary | ICD-10-CM | POA: Diagnosis not present

## 2022-08-03 DIAGNOSIS — E7849 Other hyperlipidemia: Secondary | ICD-10-CM | POA: Diagnosis not present

## 2022-08-03 DIAGNOSIS — E1165 Type 2 diabetes mellitus with hyperglycemia: Secondary | ICD-10-CM | POA: Diagnosis not present

## 2022-10-20 DIAGNOSIS — L905 Scar conditions and fibrosis of skin: Secondary | ICD-10-CM | POA: Diagnosis not present

## 2022-10-20 DIAGNOSIS — Z1283 Encounter for screening for malignant neoplasm of skin: Secondary | ICD-10-CM | POA: Diagnosis not present

## 2022-10-20 DIAGNOSIS — D485 Neoplasm of uncertain behavior of skin: Secondary | ICD-10-CM | POA: Diagnosis not present

## 2022-10-20 DIAGNOSIS — D225 Melanocytic nevi of trunk: Secondary | ICD-10-CM | POA: Diagnosis not present

## 2022-12-07 ENCOUNTER — Other Ambulatory Visit: Payer: Self-pay | Admitting: Neurological Surgery

## 2022-12-07 DIAGNOSIS — M5416 Radiculopathy, lumbar region: Secondary | ICD-10-CM

## 2022-12-14 ENCOUNTER — Other Ambulatory Visit: Payer: Self-pay | Admitting: Internal Medicine

## 2022-12-14 DIAGNOSIS — Z1231 Encounter for screening mammogram for malignant neoplasm of breast: Secondary | ICD-10-CM

## 2022-12-15 NOTE — Discharge Instructions (Signed)

## 2022-12-16 ENCOUNTER — Ambulatory Visit
Admission: RE | Admit: 2022-12-16 | Discharge: 2022-12-16 | Disposition: A | Discharge status: Home / Self Care | Payer: Medicare Other | Source: Ambulatory Visit | Attending: Neurological Surgery

## 2022-12-16 DIAGNOSIS — M5416 Radiculopathy, lumbar region: Secondary | ICD-10-CM

## 2022-12-16 DIAGNOSIS — M4727 Other spondylosis with radiculopathy, lumbosacral region: Secondary | ICD-10-CM | POA: Diagnosis not present

## 2022-12-16 MED ORDER — IOPAMIDOL (ISOVUE-M 200) INJECTION 41%
1.0000 mL | Freq: Once | INTRAMUSCULAR | Status: AC
  Administered 2022-12-16: 1 mL via EPIDURAL

## 2022-12-16 MED ORDER — METHYLPREDNISOLONE ACETATE 40 MG/ML INJ SUSP (RADIOLOG
80.0000 mg | Freq: Once | INTRAMUSCULAR | Status: AC
  Administered 2022-12-16: 80 mg via EPIDURAL

## 2023-01-09 DIAGNOSIS — Z6824 Body mass index (BMI) 24.0-24.9, adult: Secondary | ICD-10-CM | POA: Diagnosis not present

## 2023-01-09 DIAGNOSIS — I10 Essential (primary) hypertension: Secondary | ICD-10-CM | POA: Diagnosis not present

## 2023-01-09 DIAGNOSIS — Z1329 Encounter for screening for other suspected endocrine disorder: Secondary | ICD-10-CM | POA: Diagnosis not present

## 2023-01-09 DIAGNOSIS — E1165 Type 2 diabetes mellitus with hyperglycemia: Secondary | ICD-10-CM | POA: Diagnosis not present

## 2023-01-09 DIAGNOSIS — R030 Elevated blood-pressure reading, without diagnosis of hypertension: Secondary | ICD-10-CM | POA: Diagnosis not present

## 2023-01-09 DIAGNOSIS — E7849 Other hyperlipidemia: Secondary | ICD-10-CM | POA: Diagnosis not present

## 2023-01-09 DIAGNOSIS — Z1321 Encounter for screening for nutritional disorder: Secondary | ICD-10-CM | POA: Diagnosis not present

## 2023-01-09 DIAGNOSIS — B029 Zoster without complications: Secondary | ICD-10-CM | POA: Diagnosis not present

## 2023-01-09 DIAGNOSIS — E7801 Familial hypercholesterolemia: Secondary | ICD-10-CM | POA: Diagnosis not present

## 2023-01-16 DIAGNOSIS — Z1389 Encounter for screening for other disorder: Secondary | ICD-10-CM | POA: Diagnosis not present

## 2023-01-16 DIAGNOSIS — F419 Anxiety disorder, unspecified: Secondary | ICD-10-CM | POA: Diagnosis not present

## 2023-01-16 DIAGNOSIS — Z6824 Body mass index (BMI) 24.0-24.9, adult: Secondary | ICD-10-CM | POA: Diagnosis not present

## 2023-01-16 DIAGNOSIS — Z23 Encounter for immunization: Secondary | ICD-10-CM | POA: Diagnosis not present

## 2023-01-16 DIAGNOSIS — K219 Gastro-esophageal reflux disease without esophagitis: Secondary | ICD-10-CM | POA: Diagnosis not present

## 2023-01-16 DIAGNOSIS — M545 Low back pain, unspecified: Secondary | ICD-10-CM | POA: Diagnosis not present

## 2023-01-16 DIAGNOSIS — E7801 Familial hypercholesterolemia: Secondary | ICD-10-CM | POA: Diagnosis not present

## 2023-01-16 DIAGNOSIS — Z1331 Encounter for screening for depression: Secondary | ICD-10-CM | POA: Diagnosis not present

## 2023-01-16 DIAGNOSIS — R030 Elevated blood-pressure reading, without diagnosis of hypertension: Secondary | ICD-10-CM | POA: Diagnosis not present

## 2023-01-16 DIAGNOSIS — R519 Headache, unspecified: Secondary | ICD-10-CM | POA: Diagnosis not present

## 2023-01-16 DIAGNOSIS — Z0001 Encounter for general adult medical examination with abnormal findings: Secondary | ICD-10-CM | POA: Diagnosis not present

## 2023-01-20 ENCOUNTER — Ambulatory Visit
Admission: RE | Admit: 2023-01-20 | Discharge: 2023-01-20 | Disposition: A | Discharge status: Home / Self Care | Payer: Medicare Other | Source: Ambulatory Visit | Attending: Internal Medicine | Admitting: Internal Medicine

## 2023-01-20 DIAGNOSIS — Z1231 Encounter for screening mammogram for malignant neoplasm of breast: Secondary | ICD-10-CM | POA: Diagnosis not present

## 2023-02-08 DIAGNOSIS — M79671 Pain in right foot: Secondary | ICD-10-CM | POA: Diagnosis not present

## 2023-02-08 DIAGNOSIS — M533 Sacrococcygeal disorders, not elsewhere classified: Secondary | ICD-10-CM | POA: Diagnosis not present

## 2023-02-08 DIAGNOSIS — R030 Elevated blood-pressure reading, without diagnosis of hypertension: Secondary | ICD-10-CM | POA: Diagnosis not present

## 2023-02-08 DIAGNOSIS — Z6824 Body mass index (BMI) 24.0-24.9, adult: Secondary | ICD-10-CM | POA: Diagnosis not present

## 2023-04-04 DIAGNOSIS — E782 Mixed hyperlipidemia: Secondary | ICD-10-CM | POA: Diagnosis not present

## 2023-04-04 DIAGNOSIS — E1165 Type 2 diabetes mellitus with hyperglycemia: Secondary | ICD-10-CM | POA: Diagnosis not present

## 2023-04-04 DIAGNOSIS — R7401 Elevation of levels of liver transaminase levels: Secondary | ICD-10-CM | POA: Diagnosis not present

## 2023-04-04 DIAGNOSIS — E7849 Other hyperlipidemia: Secondary | ICD-10-CM | POA: Diagnosis not present

## 2023-04-04 DIAGNOSIS — E7801 Familial hypercholesterolemia: Secondary | ICD-10-CM | POA: Diagnosis not present

## 2023-07-21 DIAGNOSIS — K219 Gastro-esophageal reflux disease without esophagitis: Secondary | ICD-10-CM | POA: Diagnosis not present

## 2023-07-21 DIAGNOSIS — E782 Mixed hyperlipidemia: Secondary | ICD-10-CM | POA: Diagnosis not present

## 2023-07-21 DIAGNOSIS — Z6824 Body mass index (BMI) 24.0-24.9, adult: Secondary | ICD-10-CM | POA: Diagnosis not present

## 2023-07-21 DIAGNOSIS — Z8669 Personal history of other diseases of the nervous system and sense organs: Secondary | ICD-10-CM | POA: Diagnosis not present

## 2023-07-21 DIAGNOSIS — F419 Anxiety disorder, unspecified: Secondary | ICD-10-CM | POA: Diagnosis not present

## 2023-08-01 DIAGNOSIS — H43811 Vitreous degeneration, right eye: Secondary | ICD-10-CM | POA: Diagnosis not present

## 2023-09-29 DIAGNOSIS — E7849 Other hyperlipidemia: Secondary | ICD-10-CM | POA: Diagnosis not present

## 2023-09-29 DIAGNOSIS — E7801 Familial hypercholesterolemia: Secondary | ICD-10-CM | POA: Diagnosis not present

## 2023-09-29 DIAGNOSIS — R7401 Elevation of levels of liver transaminase levels: Secondary | ICD-10-CM | POA: Diagnosis not present

## 2023-09-29 DIAGNOSIS — I10 Essential (primary) hypertension: Secondary | ICD-10-CM | POA: Diagnosis not present

## 2023-12-21 DIAGNOSIS — E7849 Other hyperlipidemia: Secondary | ICD-10-CM | POA: Diagnosis not present

## 2023-12-21 DIAGNOSIS — D7289 Other specified disorders of white blood cells: Secondary | ICD-10-CM | POA: Diagnosis not present

## 2023-12-21 DIAGNOSIS — E1165 Type 2 diabetes mellitus with hyperglycemia: Secondary | ICD-10-CM | POA: Diagnosis not present

## 2023-12-21 DIAGNOSIS — Z1329 Encounter for screening for other suspected endocrine disorder: Secondary | ICD-10-CM | POA: Diagnosis not present

## 2023-12-21 DIAGNOSIS — E559 Vitamin D deficiency, unspecified: Secondary | ICD-10-CM | POA: Diagnosis not present

## 2023-12-27 DIAGNOSIS — Z23 Encounter for immunization: Secondary | ICD-10-CM | POA: Diagnosis not present

## 2023-12-27 DIAGNOSIS — K219 Gastro-esophageal reflux disease without esophagitis: Secondary | ICD-10-CM | POA: Diagnosis not present

## 2023-12-27 DIAGNOSIS — Z6823 Body mass index (BMI) 23.0-23.9, adult: Secondary | ICD-10-CM | POA: Diagnosis not present

## 2023-12-27 DIAGNOSIS — E7849 Other hyperlipidemia: Secondary | ICD-10-CM | POA: Diagnosis not present

## 2023-12-27 DIAGNOSIS — F419 Anxiety disorder, unspecified: Secondary | ICD-10-CM | POA: Diagnosis not present

## 2023-12-27 DIAGNOSIS — Z8669 Personal history of other diseases of the nervous system and sense organs: Secondary | ICD-10-CM | POA: Diagnosis not present

## 2023-12-27 DIAGNOSIS — E782 Mixed hyperlipidemia: Secondary | ICD-10-CM | POA: Diagnosis not present

## 2024-01-01 ENCOUNTER — Other Ambulatory Visit: Payer: Self-pay | Admitting: Internal Medicine

## 2024-01-01 DIAGNOSIS — Z1231 Encounter for screening mammogram for malignant neoplasm of breast: Secondary | ICD-10-CM

## 2024-01-02 DIAGNOSIS — E049 Nontoxic goiter, unspecified: Secondary | ICD-10-CM | POA: Diagnosis not present

## 2024-02-01 ENCOUNTER — Ambulatory Visit

## 2024-02-06 DIAGNOSIS — Z1211 Encounter for screening for malignant neoplasm of colon: Secondary | ICD-10-CM | POA: Diagnosis not present

## 2024-02-06 DIAGNOSIS — Z1212 Encounter for screening for malignant neoplasm of rectum: Secondary | ICD-10-CM | POA: Diagnosis not present

## 2024-02-13 LAB — COLOGUARD: COLOGUARD: NEGATIVE

## 2024-02-20 ENCOUNTER — Ambulatory Visit

## 2024-03-26 ENCOUNTER — Ambulatory Visit
Admission: RE | Admit: 2024-03-26 | Discharge: 2024-03-26 | Disposition: A | Source: Ambulatory Visit | Attending: Internal Medicine | Admitting: Internal Medicine

## 2024-03-26 DIAGNOSIS — Z1231 Encounter for screening mammogram for malignant neoplasm of breast: Secondary | ICD-10-CM
